# Patient Record
Sex: Female | Born: 1980 | Race: White | Hispanic: No | Marital: Single | Smoking: Never smoker
Health system: Southern US, Community
[De-identification: ages and names within clinical notes are randomized; demographics above are authoritative.]

## PROBLEM LIST (undated history)

## (undated) HISTORY — PX: TUBAL LIGATION: SHX77

---

## 2003-09-12 ENCOUNTER — Inpatient Hospital Stay (HOSPITAL_COMMUNITY): Admission: AD | Admit: 2003-09-12 | Discharge: 2003-09-18 | Payer: Self-pay | Admitting: Obstetrics and Gynecology

## 2003-09-15 ENCOUNTER — Encounter (INDEPENDENT_AMBULATORY_CARE_PROVIDER_SITE_OTHER): Payer: Self-pay | Admitting: Cardiology

## 2003-09-28 ENCOUNTER — Other Ambulatory Visit: Admission: RE | Admit: 2003-09-28 | Discharge: 2003-09-28 | Payer: Self-pay | Admitting: Obstetrics and Gynecology

## 2003-10-13 ENCOUNTER — Other Ambulatory Visit: Admission: RE | Admit: 2003-10-13 | Discharge: 2003-10-13 | Payer: Self-pay | Admitting: Obstetrics and Gynecology

## 2003-10-13 ENCOUNTER — Ambulatory Visit (HOSPITAL_COMMUNITY): Admission: RE | Admit: 2003-10-13 | Discharge: 2003-10-13 | Payer: Self-pay | Admitting: Obstetrics and Gynecology

## 2003-11-28 ENCOUNTER — Inpatient Hospital Stay (HOSPITAL_COMMUNITY): Admission: RE | Admit: 2003-11-28 | Discharge: 2003-11-30 | Payer: Self-pay | Admitting: Obstetrics and Gynecology

## 2003-11-29 ENCOUNTER — Ambulatory Visit: Payer: Self-pay | Admitting: Neonatology

## 2003-12-02 ENCOUNTER — Ambulatory Visit (HOSPITAL_COMMUNITY): Admission: RE | Admit: 2003-12-02 | Discharge: 2003-12-02 | Payer: Self-pay | Admitting: Obstetrics and Gynecology

## 2003-12-06 ENCOUNTER — Ambulatory Visit (HOSPITAL_COMMUNITY): Admission: RE | Admit: 2003-12-06 | Discharge: 2003-12-06 | Payer: Self-pay | Admitting: Obstetrics and Gynecology

## 2003-12-09 ENCOUNTER — Ambulatory Visit (HOSPITAL_COMMUNITY): Admission: RE | Admit: 2003-12-09 | Discharge: 2003-12-09 | Payer: Self-pay | Admitting: Obstetrics and Gynecology

## 2003-12-13 ENCOUNTER — Ambulatory Visit (HOSPITAL_COMMUNITY): Admission: RE | Admit: 2003-12-13 | Discharge: 2003-12-13 | Payer: Self-pay | Admitting: Obstetrics and Gynecology

## 2003-12-16 ENCOUNTER — Ambulatory Visit (HOSPITAL_COMMUNITY): Admission: RE | Admit: 2003-12-16 | Discharge: 2003-12-16 | Payer: Self-pay | Admitting: Obstetrics and Gynecology

## 2003-12-20 ENCOUNTER — Inpatient Hospital Stay (HOSPITAL_COMMUNITY): Admission: RE | Admit: 2003-12-20 | Discharge: 2003-12-21 | Payer: Self-pay | Admitting: Obstetrics and Gynecology

## 2003-12-26 ENCOUNTER — Ambulatory Visit (HOSPITAL_COMMUNITY): Admission: RE | Admit: 2003-12-26 | Discharge: 2003-12-26 | Payer: Self-pay | Admitting: Obstetrics and Gynecology

## 2003-12-30 ENCOUNTER — Ambulatory Visit (HOSPITAL_COMMUNITY): Admission: RE | Admit: 2003-12-30 | Discharge: 2003-12-30 | Payer: Self-pay | Admitting: Obstetrics and Gynecology

## 2004-01-02 ENCOUNTER — Ambulatory Visit (HOSPITAL_COMMUNITY): Admission: RE | Admit: 2004-01-02 | Discharge: 2004-01-02 | Payer: Self-pay | Admitting: Obstetrics and Gynecology

## 2004-01-05 ENCOUNTER — Ambulatory Visit (HOSPITAL_COMMUNITY): Admission: RE | Admit: 2004-01-05 | Discharge: 2004-01-05 | Payer: Self-pay | Admitting: Obstetrics and Gynecology

## 2004-01-12 ENCOUNTER — Ambulatory Visit (HOSPITAL_COMMUNITY): Admission: RE | Admit: 2004-01-12 | Discharge: 2004-01-12 | Payer: Self-pay | Admitting: Obstetrics and Gynecology

## 2004-01-17 ENCOUNTER — Ambulatory Visit (HOSPITAL_COMMUNITY): Admission: RE | Admit: 2004-01-17 | Discharge: 2004-01-17 | Payer: Self-pay | Admitting: Obstetrics and Gynecology

## 2004-01-19 ENCOUNTER — Ambulatory Visit (HOSPITAL_COMMUNITY): Admission: RE | Admit: 2004-01-19 | Discharge: 2004-01-19 | Payer: Self-pay | Admitting: Obstetrics and Gynecology

## 2004-01-23 ENCOUNTER — Ambulatory Visit: Payer: Self-pay | Admitting: Neonatology

## 2004-01-23 ENCOUNTER — Inpatient Hospital Stay (HOSPITAL_COMMUNITY): Admission: RE | Admit: 2004-01-23 | Discharge: 2004-01-27 | Payer: Self-pay | Admitting: Obstetrics and Gynecology

## 2004-01-24 ENCOUNTER — Encounter (INDEPENDENT_AMBULATORY_CARE_PROVIDER_SITE_OTHER): Payer: Self-pay | Admitting: *Deleted

## 2004-05-05 ENCOUNTER — Inpatient Hospital Stay (HOSPITAL_COMMUNITY): Admission: AD | Admit: 2004-05-05 | Discharge: 2004-05-05 | Payer: Self-pay | Admitting: Obstetrics & Gynecology

## 2004-10-12 ENCOUNTER — Other Ambulatory Visit: Admission: RE | Admit: 2004-10-12 | Discharge: 2004-10-12 | Payer: Self-pay | Admitting: Obstetrics and Gynecology

## 2005-02-06 ENCOUNTER — Inpatient Hospital Stay (HOSPITAL_COMMUNITY): Admission: AD | Admit: 2005-02-06 | Discharge: 2005-02-06 | Payer: Self-pay | Admitting: Obstetrics and Gynecology

## 2005-02-19 ENCOUNTER — Emergency Department (HOSPITAL_COMMUNITY): Admission: EM | Admit: 2005-02-19 | Discharge: 2005-02-20 | Payer: Self-pay | Admitting: Emergency Medicine

## 2005-12-13 ENCOUNTER — Ambulatory Visit: Payer: Self-pay | Admitting: Family Medicine

## 2006-02-12 ENCOUNTER — Ambulatory Visit: Payer: Self-pay | Admitting: Family Medicine

## 2006-02-12 ENCOUNTER — Other Ambulatory Visit: Admission: RE | Admit: 2006-02-12 | Discharge: 2006-02-12 | Payer: Self-pay | Admitting: Family Medicine

## 2006-03-29 IMAGING — US US OB FOLLOW-UP
1 series · 12 of 28 positions shown · non-contrast
Comparison: none

CLINICAL DATA: Growth and Dopplers.

[Series 1: us ob follow-up · 0.35mm/px · 12 of 89 slices shown]
[im 4/89]
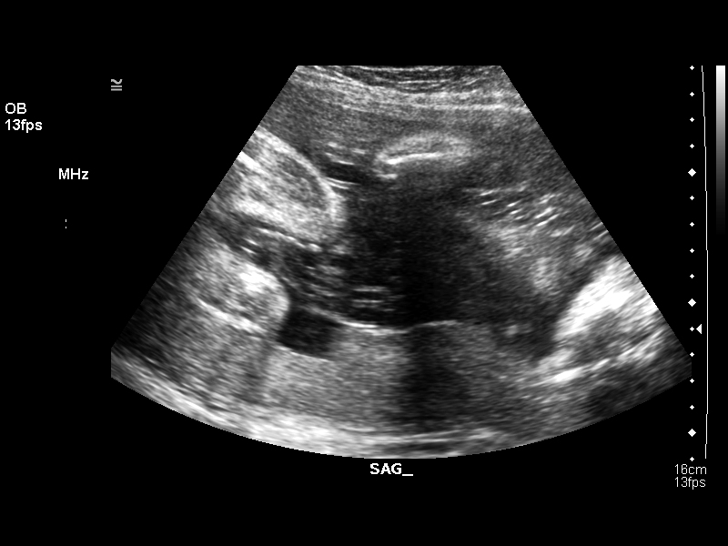
[im 10/89]
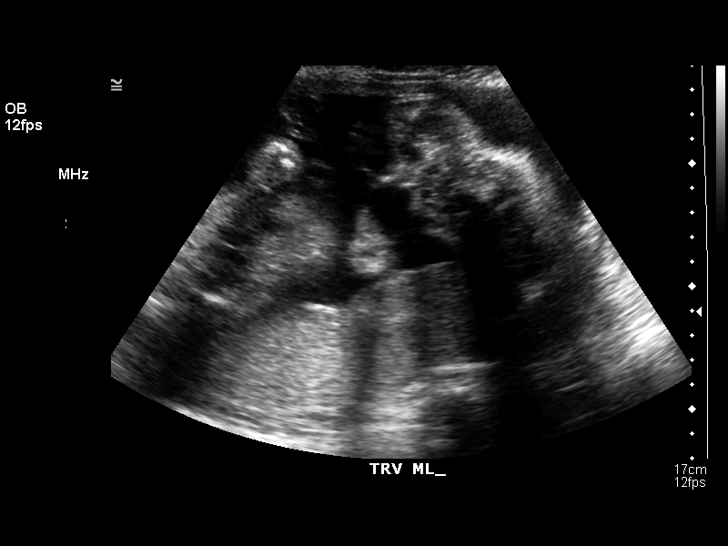
[im 17/89]
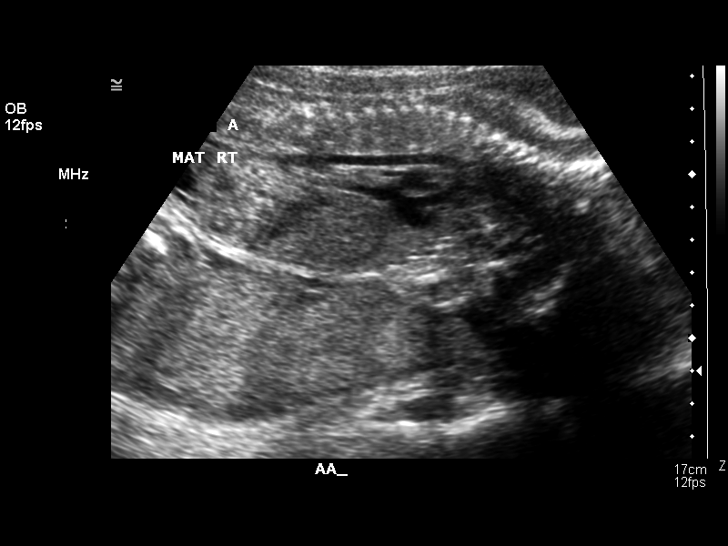
[im 27/89]
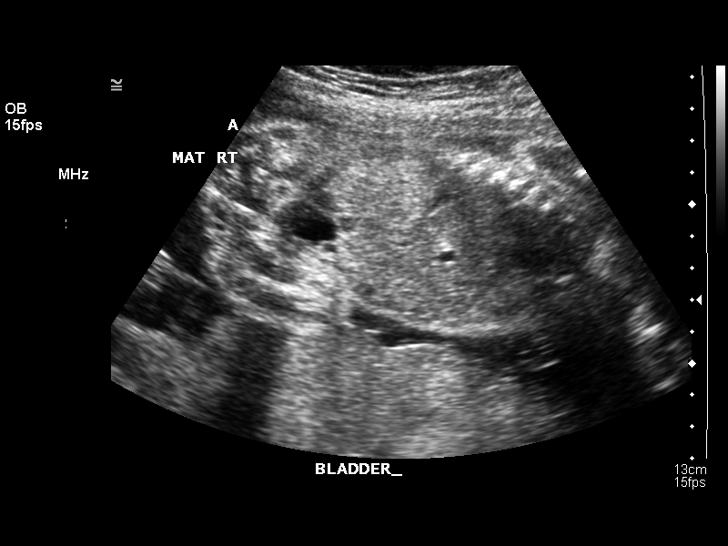
[im 33/89]
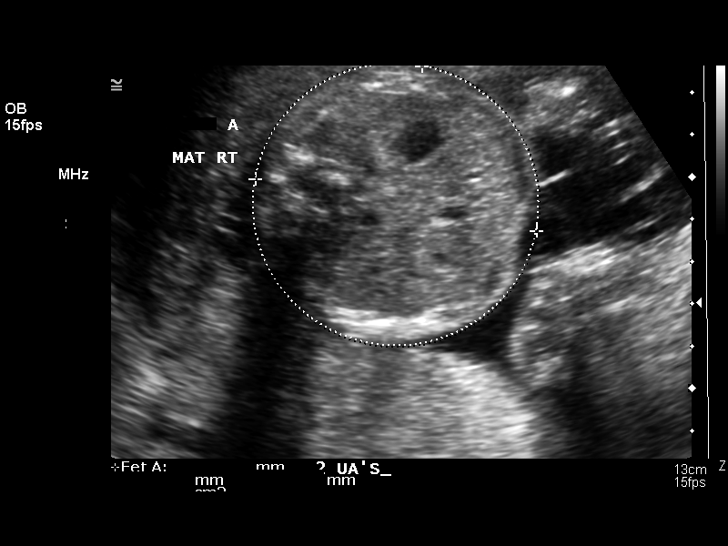
[im 40/89]
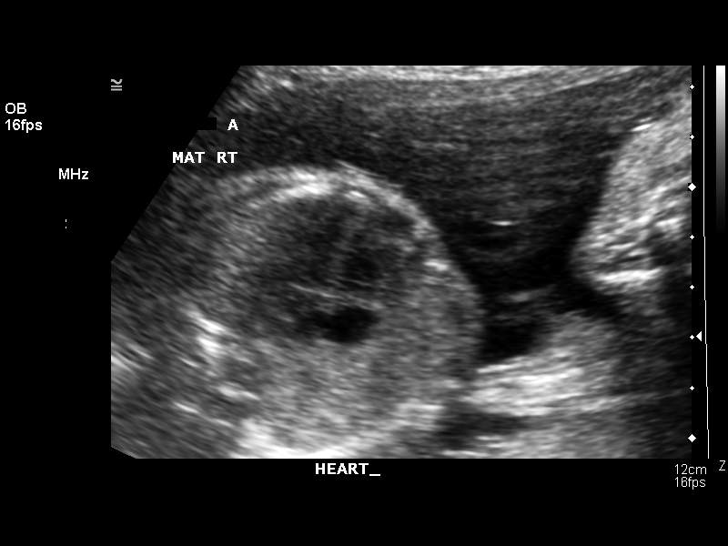
[im 49/89]
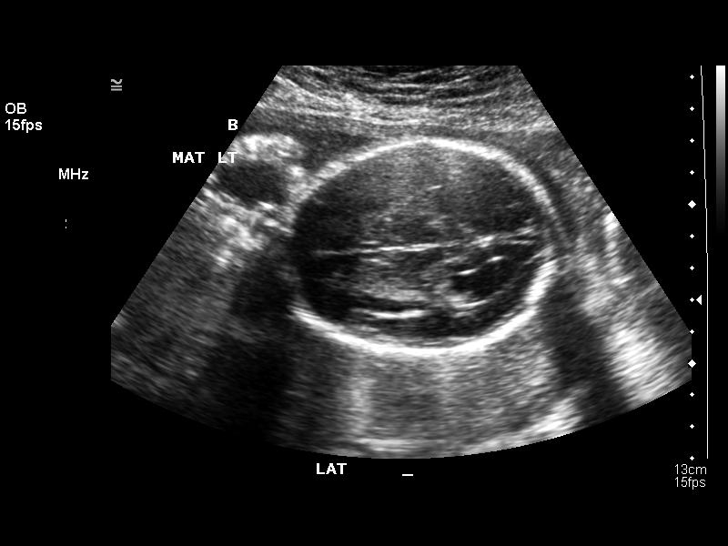
[im 56/89]
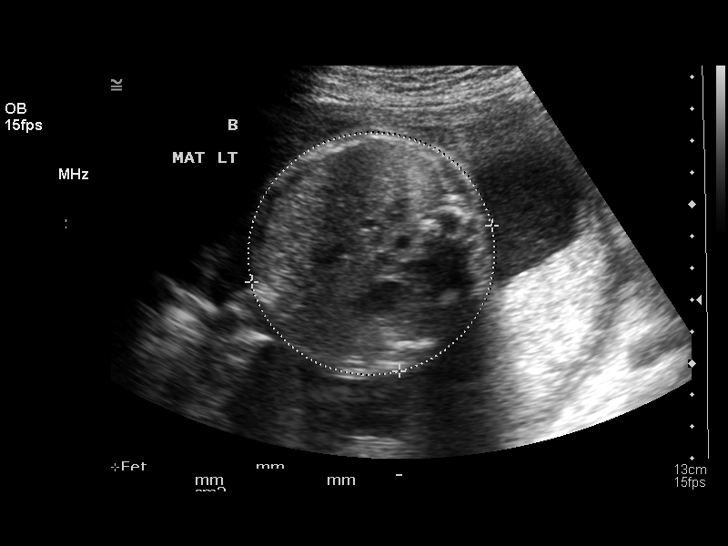
[im 62/89]
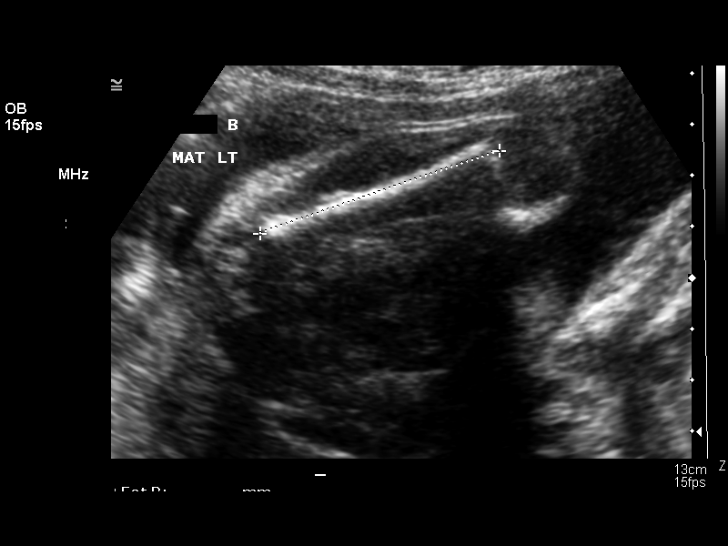
[im 72/89]
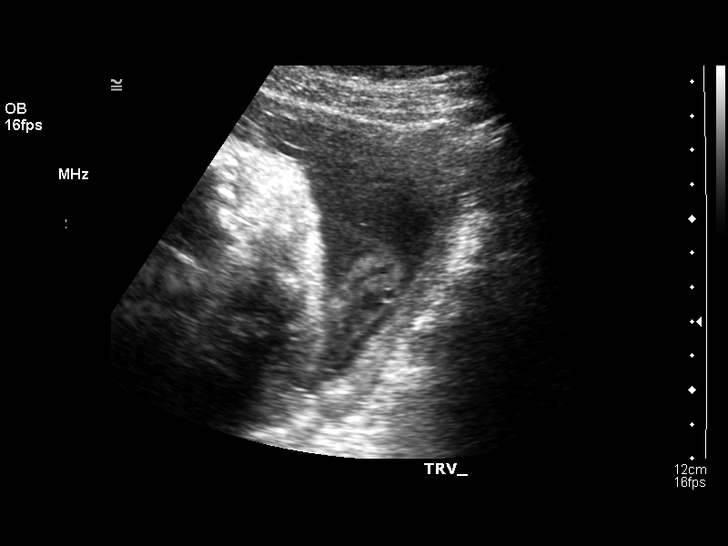
[im 79/89]
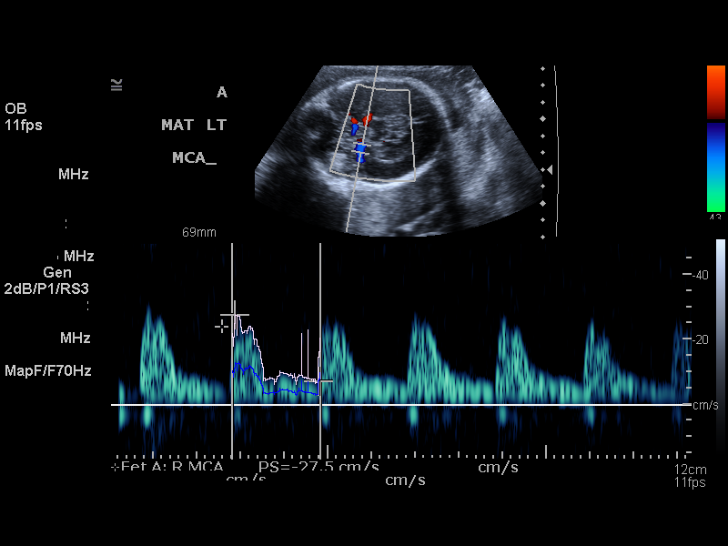
[im 85/89]
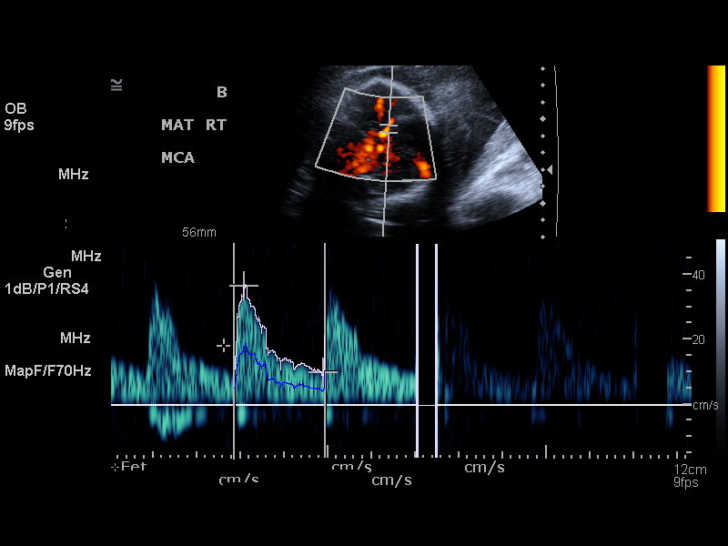

[12 of 28 positions shown; findings below may reference images not displayed]

TWIN OBSTETRICAL ULTRASOUND REEVALUATION:
A living twin gestation is present.  A separating membrane is seen.

TWIN A:
Heart Rate:  163
Movement:  Yes
Breathing:  Yes
Presentation:   Cephalic on the maternal left 
Placental Location:  Posterior
Grade:  I
Previa:  No
Amniotic Fluid (Subjective):    Normal
Amniotic Fluid (Objective):   3.1 cm Vertical pocket 

FETAL BIOMETRY (TWIN A)
BPD:  6.6 cm   26 w 5 d
HC:  24.8 cm   27 w 0 d
AC:  23.6 cm   27 w 6 d
FL:  5.0 cm  26 w 6 d

Mean GA:  27 w 1 d
Fetal indices are within normal limits
EFW:  9967 g (H) 75th ? 90th%ile (1914 ? 4466 g) for 27 wks

FETAL ANATOMY (TWIN A)
Lateral Ventricles:  Visualized 
Thalami/CSP:  Visualized 
Posterior Fossa:  Previously seen 
Nuchal Region:  Previously seen 
Spine:  Previously seen 
4 Chamber Heart on L:  Previously seen 
Stomach on Left:  Visualized 
3 Vessel Cord:  Previously seen 
Cord Insertion Site:  Previously seen 
Kidneys:  Visualized 
Bladder:  Visualized 
Extremities:  Previously seen 
Comment:  The lateral ventricular measurement for twin A today is 9.7 mm.  This is just below the upper limits of normal and continued close follow-up for ventricular size is recommended to exclude developing early ventricular dilatation.
TWIN B:
Heart Rate:  160
Movement:  Yes
Breathing:  Yes
Presentation:  Cephalic on the maternal right TWIN B IS PRESENTING TODAY
Placental Location:  Posterior
Grade:  I
Previa:  No
Amniotic Fluid (Subjective):  Normal
Amniotic Fluid (Objective):   4.6 cm Vertical pocket 

FETAL BIOMETRY (TWIN B)
BPD:  6.7 cm   27 w 0 d
HC:  24.3 cm   26 w 3 d
AC:  21.0 cm   25 w 4 d
FL:  4.7 cm   25 w 4 d

Mean GA:  26 w 1 d
Fetal indices are within normal limits
EFW:  839 g (H) 25th ? 50th%ile (754 ? 875 g) For 27 wks

FETAL ANATOMY (TWIN B)
Lateral Ventricles:  Visualized 
Thalami/CSP:  Visualized 
Posterior Fossa:  Previously seen 
Nuchal Region:  Previously seen 
Spine:  Previously seen 
4 Chamber Heart on L:  Visualized 
Stomach on Left:  Visualized 
3 Vessel Cord:  Visualized 
Cord Insertion Site:  Visualized 
Kidneys:  Visualized 
Bladder:  Visualized 
Extremities:  Previously seen 

MATERNAL FINDINGS
Cervix:  3.5 cm Transabdominally

DOPPLER ULTRASOUND OF TWIN GESTATION:

DOPPLER MEASUREMENTS (TWIN A)
Umbilical Artery S/D Ratio: 272   (NL< 4.76)

Middle Cerebral Artery PI: 1.80   (NL> 1.54)

DOPPLER MEASUREMENTS (TWIN B)
Umbilical Artery S/D Ratio: 3.52   (NL< 4.76)

Middle Cerebral Artery PI:  1.42    (NL> 1.54)
IMPRESSION: Monochorionic, diamniotic twin gestation with twin A demonstrating an estimated gestational age by ultrasound of 27 weeks and 1 day and twin B of 26 weeks and 1 day.  Currently the estimated fetal weight for twin A is just above the 75th percentile and the estimated fetal weight for twin B is just below the 50th percentile.  Today?s findings suggest discordance in growth and for this reason fetal Doppler exam was performed.  Continued close follow-up for growth is recommended.
Subjectively and quantitatively normal amniotic fluid volume is seen for both twins.  
Normal umbilical artery and middle cerebral artery Doppler assessment for twin A.  
Normal umbilical artery and low middle cerebral artery Doppler value for twin B.  No absence or reversal of end diastolic flow was seen on any of interrogated strips.  
Normal cervical length.
The lateral ventricular measurement on twin A is at the upper limits of normal at 9.7 mm and continued close follow-up is recommended to exclude developing early ventriculomegaly.
Twin B is currently presenting.  
Today?s scan findings were called to Dr. Gurajena?[REDACTED] and the patient was sent to [facility] for follow-up immediately after this exam.

## 2006-04-06 IMAGING — US US UA ADDL GEST RE-EVAL
1 series · 13 of 16 positions shown · non-contrast
Comparison: none

CLINICAL DATA: Monochorionic, diamniotic twin gestation with assigned gestational age of 28 weeks 0 days.  Decreased amniotic fluid volume for twin B.  Follow-up amniotic fluid volume, biophysical profile, and umbilical artery Dopplers.

[Series 1: us ua addl gest re-eval · 0.29mm/px · 13 of 57 slices shown]
[im 1/57]
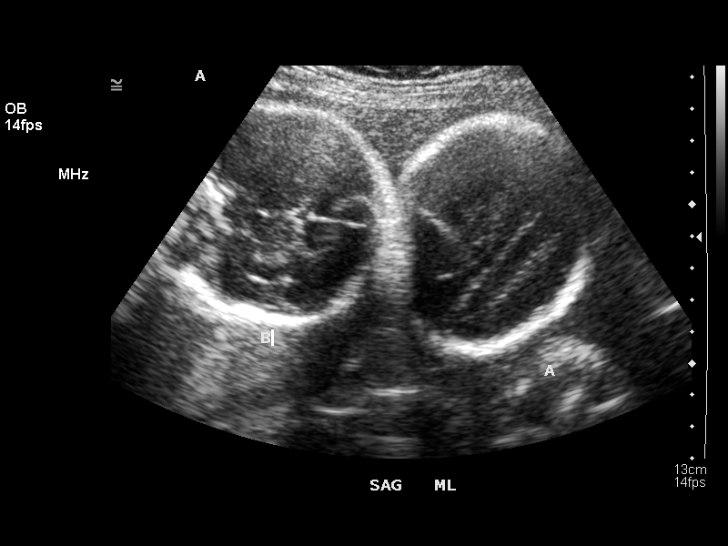
[im 4/57]
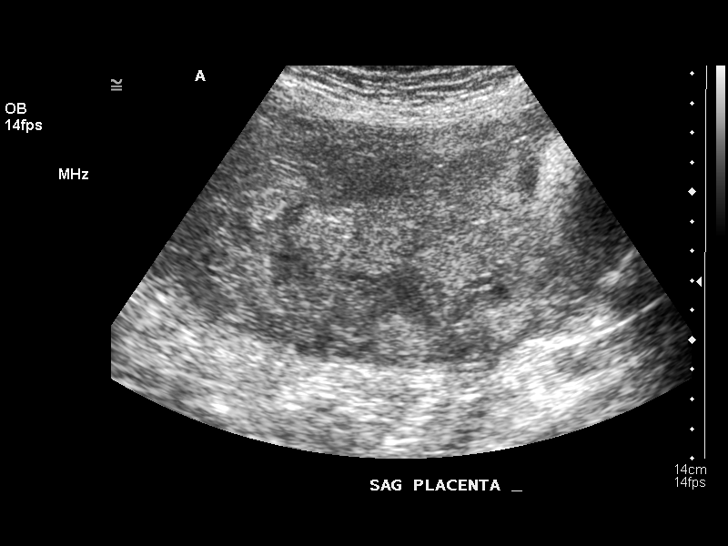
[im 12/57]
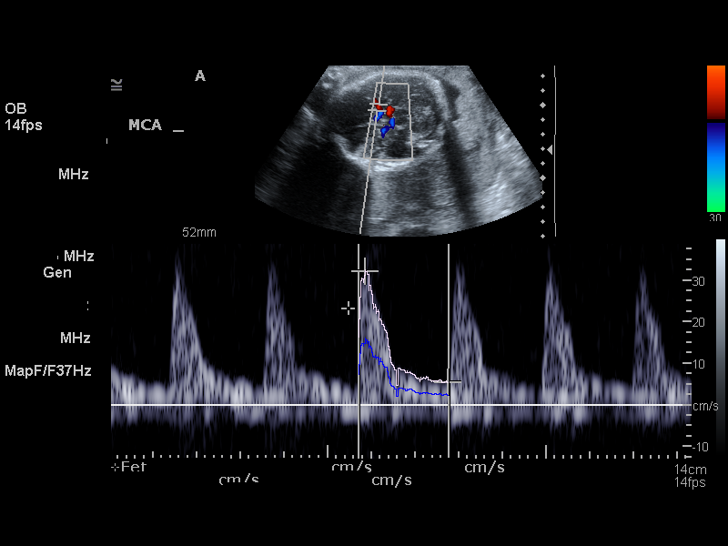
[im 15/57]
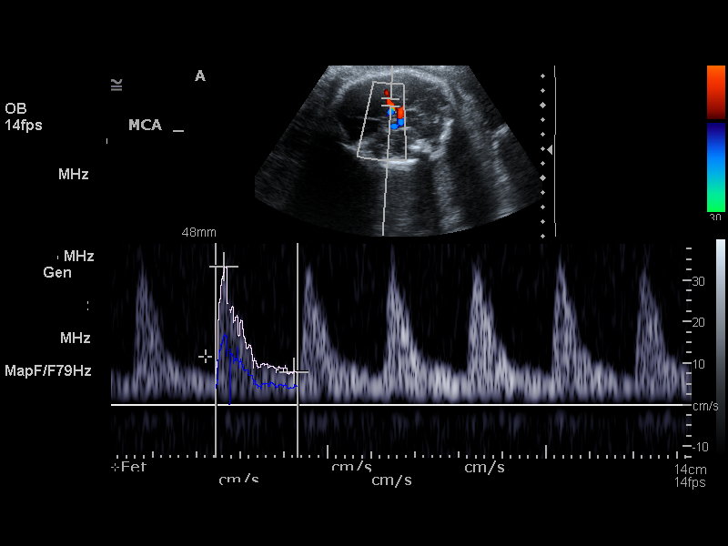
[im 19/57]
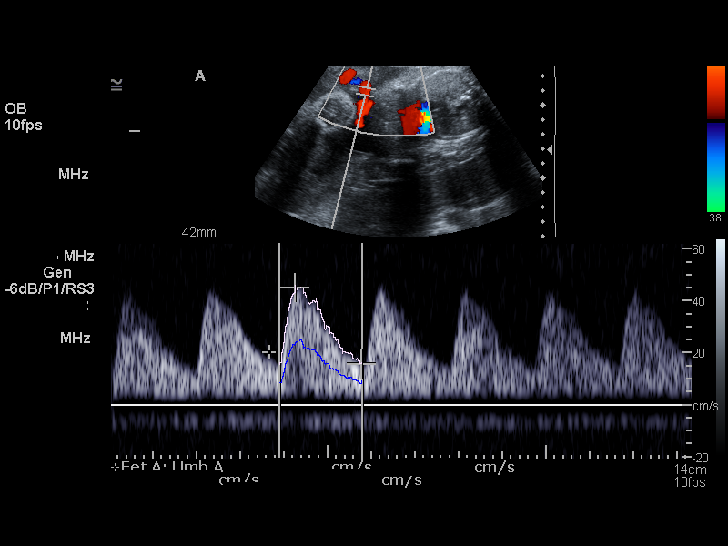
[im 23/57]
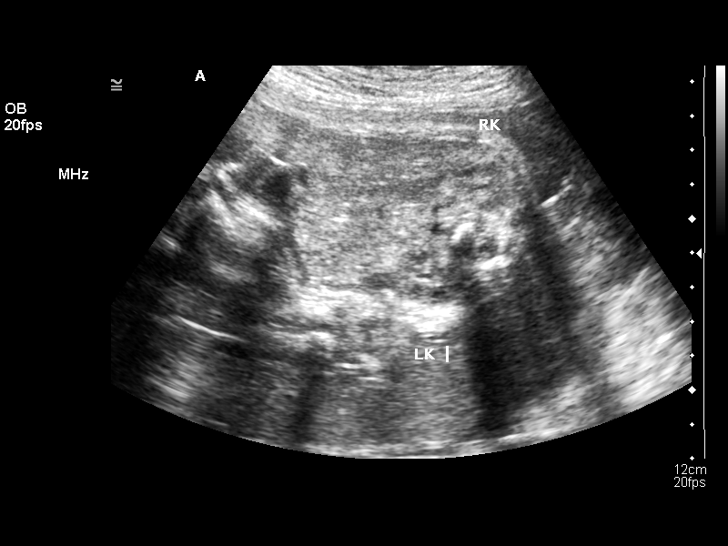
[im 30/57]
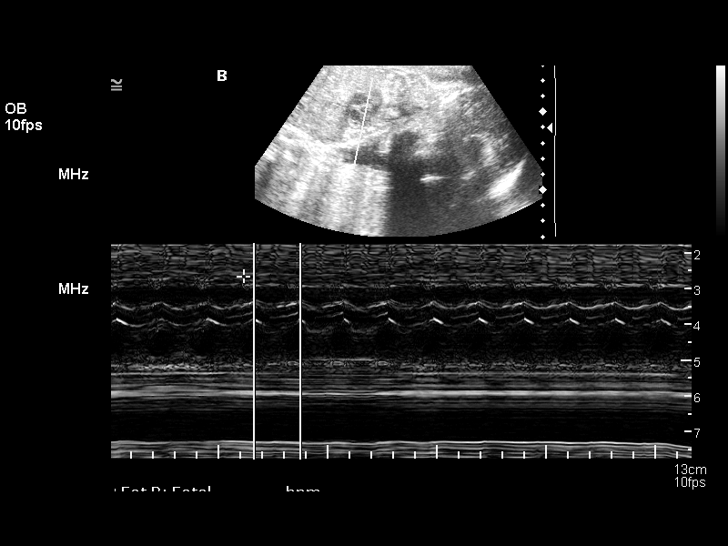
[im 34/57]
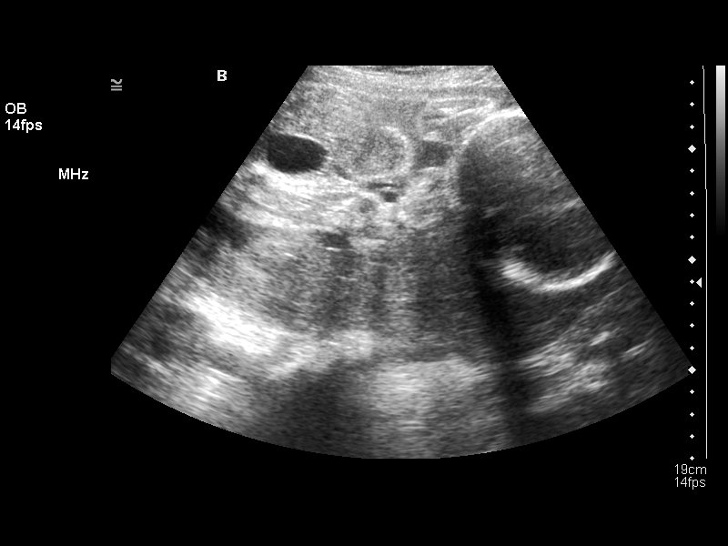
[im 38/57]
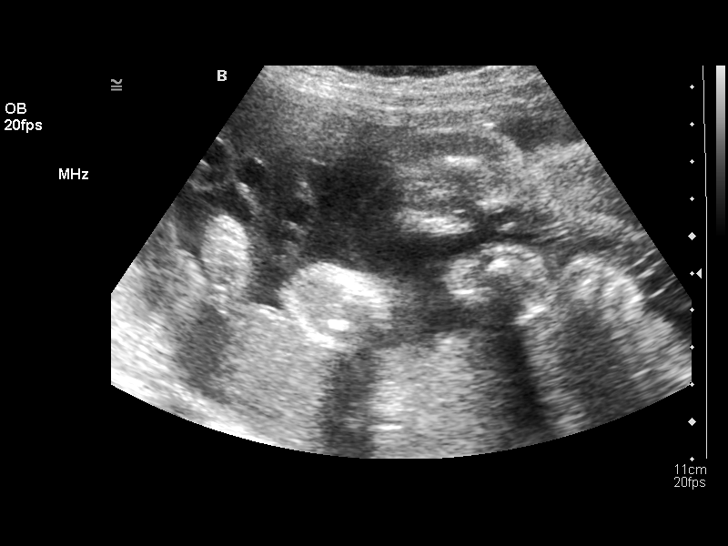
[im 42/57]
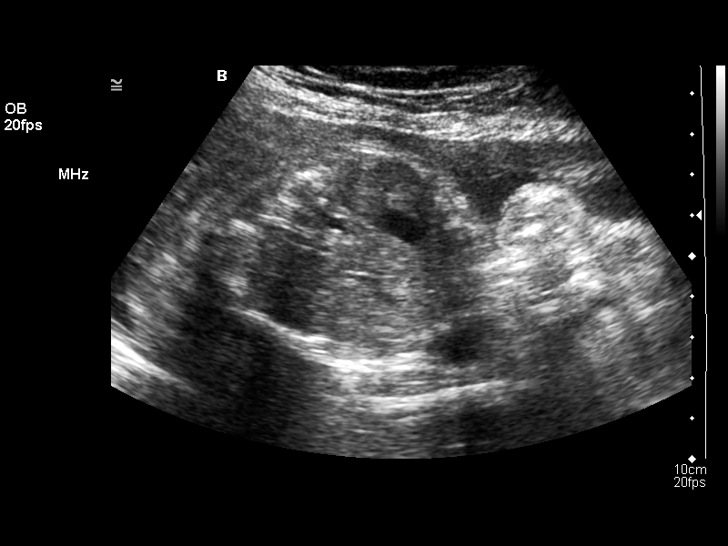
[im 45/57]
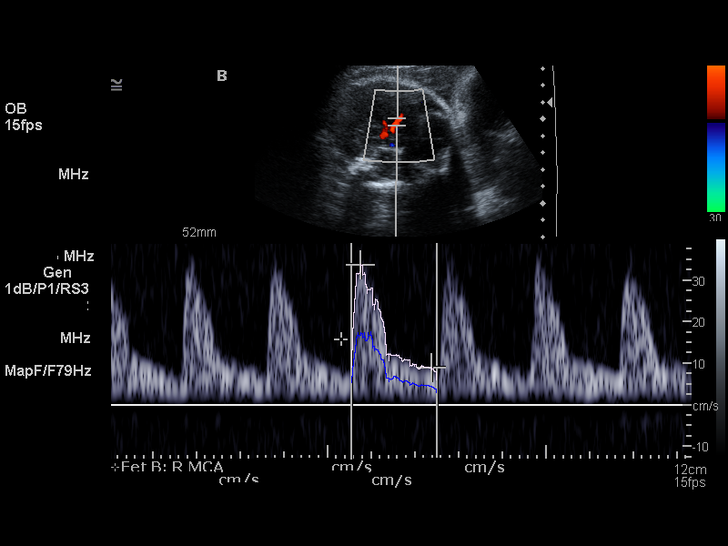
[im 53/57]
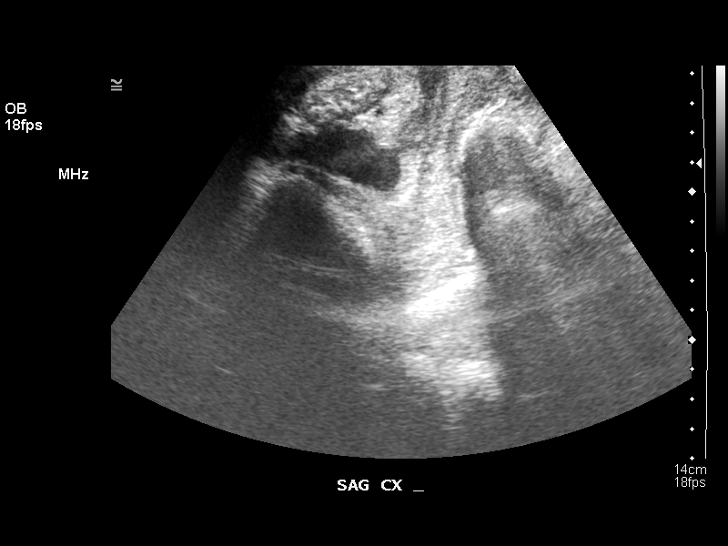
[im 57/57]
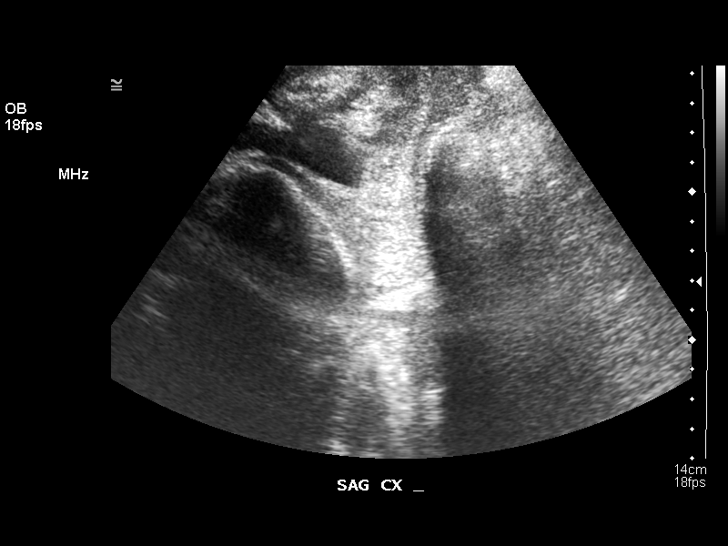

[13 of 16 positions shown; findings below may reference images not displayed]

TWIN GESTATION LIMITED OBSTETRICAL ULTRASOUND:

A living twin gestation is present.  A separating membrane is seen.

TWIN A (NON-PRESENTING):
Heart Rate:  147
Movement:  Yes
Breathing:  Yes
Presentation:  Cephalic on the maternal left side
Placental Location:  Posterior
Grade:  I
Previa:  No
Amniotic Fluid (Subjective):  Normal
Amniotic Fluid (Objective):  3.0 cm Vertical pocket 

Fetal measurements and complete anatomic evaluation were not requested.  The following fetal anatomy for Twin A was visualized during this exam:  Lateral ventricles, stomach, kidneys, and bladder.  

TWIN B (PRESENTING TWIN):
Heart Rate:  141
Movement:  Yes
Breathing:  Yes
Presentation:  Cephalic on the maternal right
Placental Location:  Posterior
Grade: I 
Previa:    No
Amniotic Fluid (Subjective):  Normal
Amniotic Fluid (Objective):  3.8 cm Vertical pocket 

Fetal measurements and complete anatomic evaluation were not requested.  The following fetal anatomy for Twin B was visualized during this exam:  Lateral ventricles, posterior fossa, four chamber heart, kidneys, bladder.

MATERNAL FINDINGS
Cervix:  3.0 cm translabially
IMPRESSION: Living monochorionic, diamniotic twin gestation.  Normal amniotic fluid volume for both twins.  
BIOPHYSICAL PROFILE OF TWIN GESTATION:

BPP SCORING (TWIN A)
Movement:  2    Time:  30 minutes
Breathing:  2
Tone:  2
Amniotic Fluid:  2

Total Score:  8

BPP SCORING (TWIN B)
Movements:  2    Time:  30 minutes
Breathing:  2
Tone:  2
Amniotic Fluid:  2

Total Score:  8
IMPRESSION: Biophysical profile score [DATE] for both fetuses.
UA DOPPLER ULTRASOUND OF TWIN GESTATION:

TWIN A
Umbilical Artery S/D Ratio:  3.12   (NL< 4.55)

TWIN B
Umbilical Artery S/D Ratio:  2.56    (NL< 4.55)
IMPRESSION: Umbilical artery S/D ratios are within normal limits for both fetuses.

## 2006-04-15 ENCOUNTER — Ambulatory Visit: Payer: Self-pay | Admitting: Family Medicine

## 2006-04-21 IMAGING — US US UA DOPPLER RE-EVAL
1 series · 13 of 28 positions shown · non-contrast
Comparison: none

CLINICAL DATA: Twin gestation with growth discrepancy.  Twin B oligohydramnios.

[Series 1: us ua doppler re-eval · 0.20mm/px · 13 of 57 slices shown]
[im 3/57]
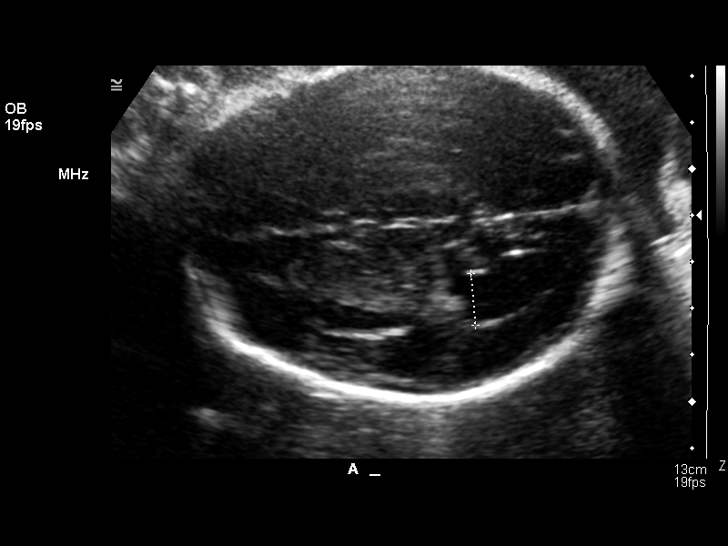
[im 7/57]
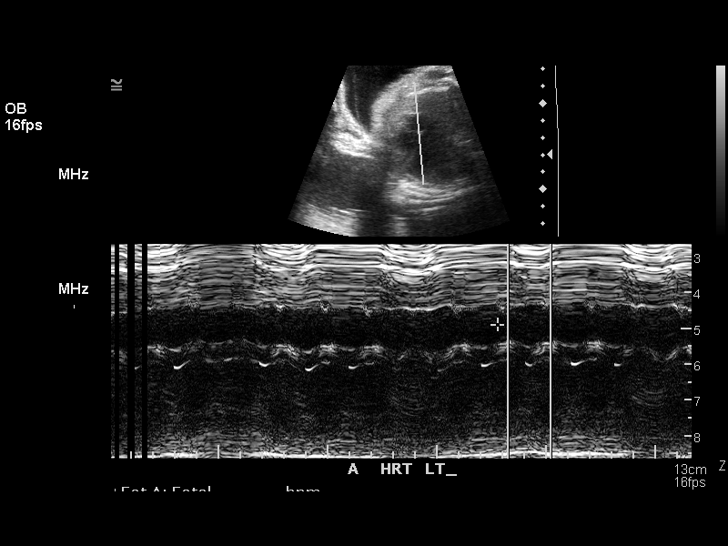
[im 11/57]
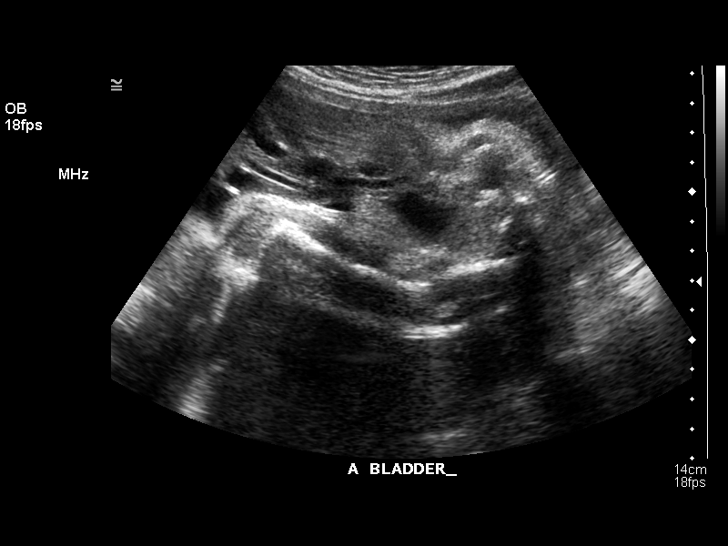
[im 15/57]
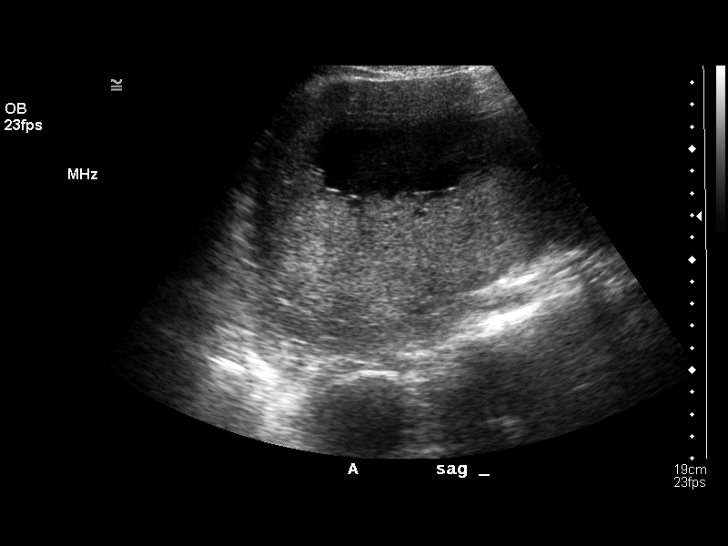
[im 19/57]
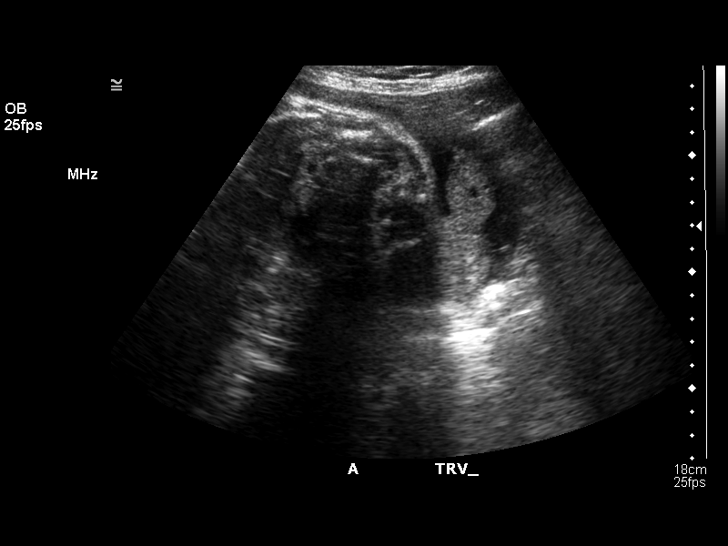
[im 23/57]
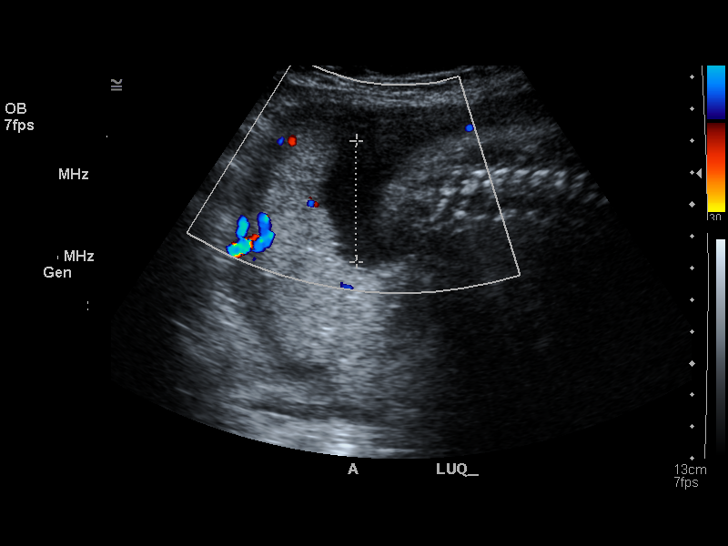
[im 30/57]
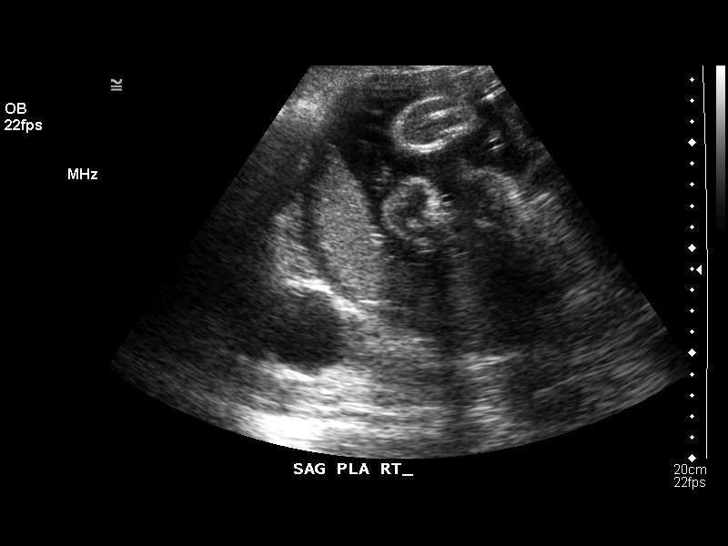
[im 34/57]
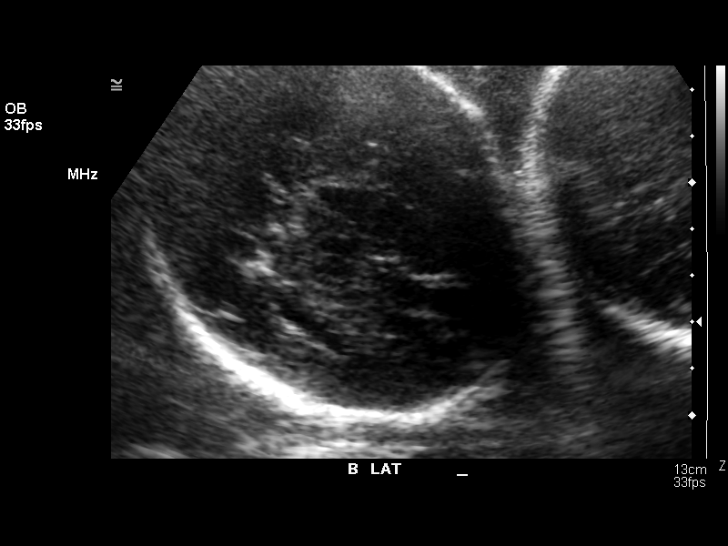
[im 38/57]
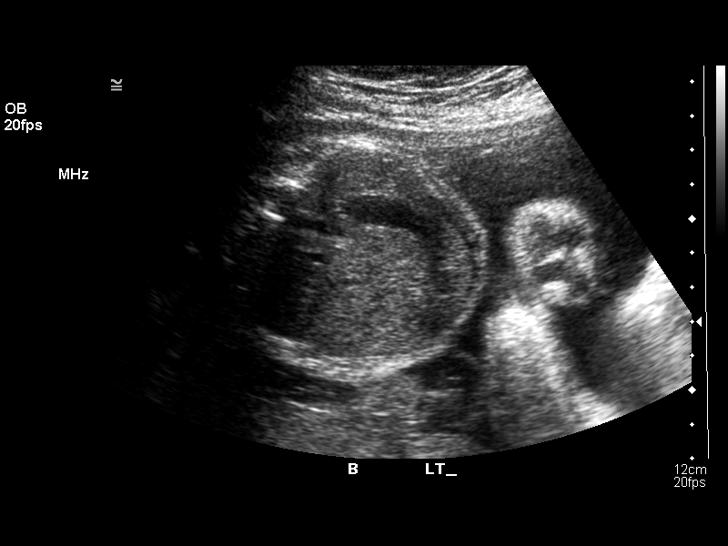
[im 42/57]
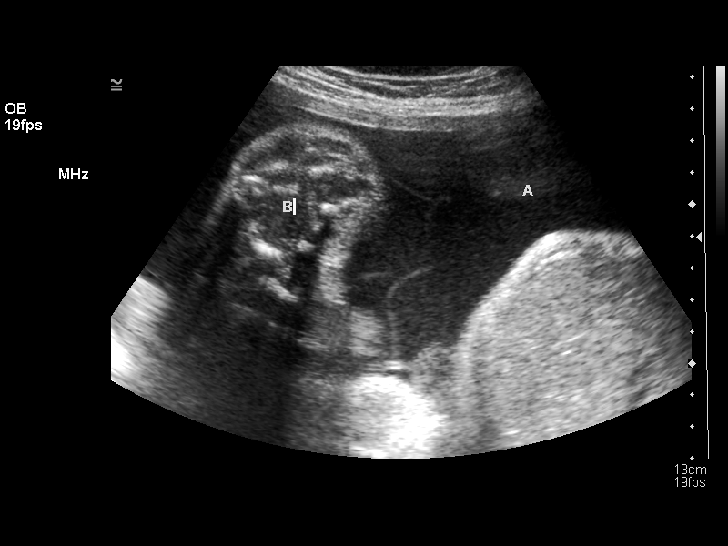
[im 46/57]
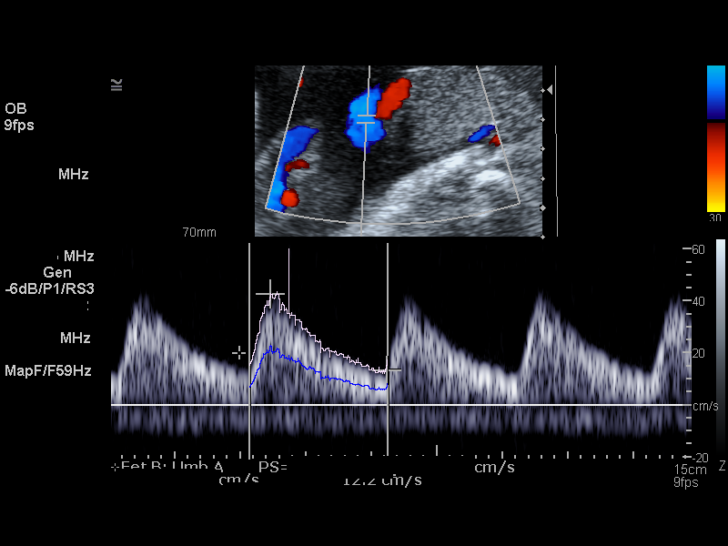
[im 50/57]
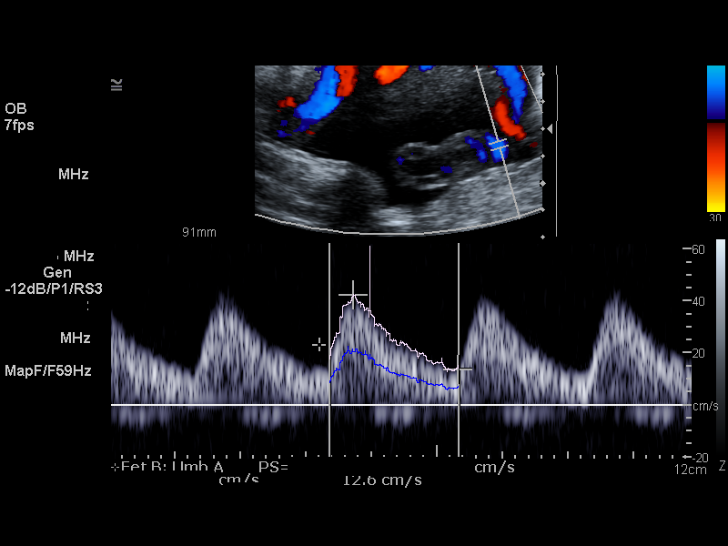
[im 54/57]
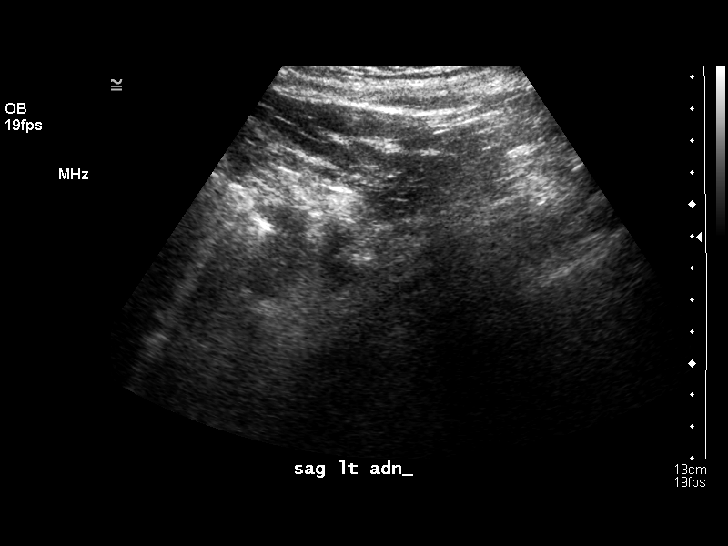

[13 of 28 positions shown; findings below may reference images not displayed]

TWIN GESTATION LIMITED OBSTETRICAL ULTRASOUND:

 A living twin gestation is present.  A separating membrane is seen.

 TWIN A (NONPRESENTING):  
 Heart Rate:  153
 Movement:  Yes
 Breathing:  Yes
 Presentation:  Cephalic on the maternal left
 Placental Location:  Posterior
 Grade:  I
 Previa:  No
 Amniotic Fluid (Subjective):  Normal
 Amniotic Fluid (Objective):  3.8 cm Vertical pocket 

 Fetal measurements and complete anatomic evaluation were not requested.  The following fetal anatomy for Twin A was visualized during this exam:  Lateral ventricles, four chamber heart, stomach, kidneys, profile, diaphragm, and female genitalia.  

 TWIN B (PRESENTING):
 Heart Rate:  157
 Movement:  Yes
 Breathing:  Yes
 Presentation:  Cephalic on the maternal right (Presenting)
 Placental Location:  Posterior
 Grade:  I
 Previa:  No
 Amniotic Fluid (Subjective):  Normal
 Amniotic Fluid (Objective):  4.4 cm Vertical pocket 

 Fetal measurements and complete anatomic evaluation were not requested.  The following fetal anatomy for Twin B was visualized during this exam:   Lateral ventricles stomach, kidneys and bladder.  

 MATERNAL UTERINE AND ADNEXAL FINDINGS
 Cervix:  3.6 cm Transabdominally

 DOPPLER ULTRASOUND OF TWIN B:  

 Umbilical Artery S/D Ratio:   3.13   (NL< 4.17)
IMPRESSION: 1.  Twin living intrauterine gestation described previously as monochorionic, diamniotic.  Assigned gestational age for the pregnancy is 30 weeks 1 day.  
 2.  Lateral ventricle for the A twin is measured at 11 mm.  
 3.  Umbilical artery Doppler evaluation for twin B is within normal limits.

## 2006-05-03 IMAGING — US US FETAL BPP W/O NONSTRESS
1 series · 13 of 26 positions shown · non-contrast
Comparison: none

CLINICAL DATA: Twins.  Assess fetal well-being.

[Series 1: us fetal bpp w/o nonstress · 13 of 26 slices shown]
[im 2/26]
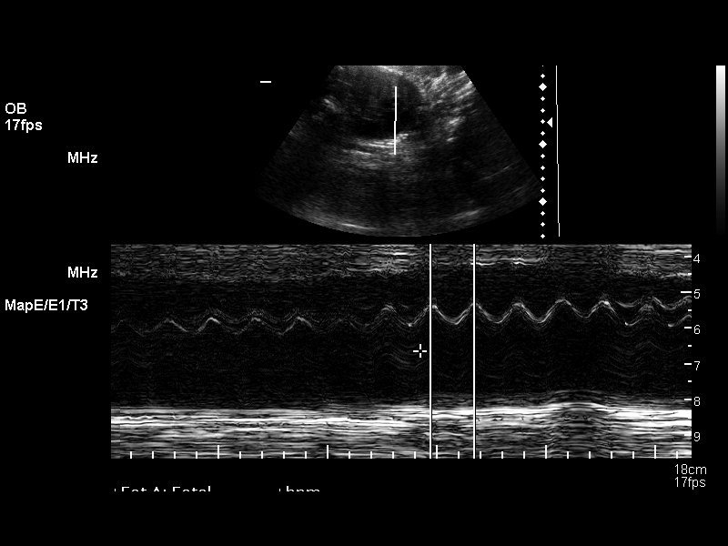
[im 4/26]
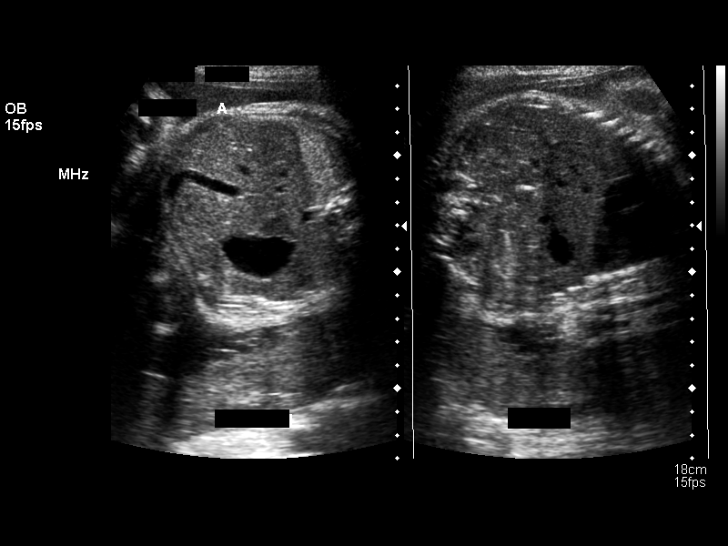
[im 6/26]
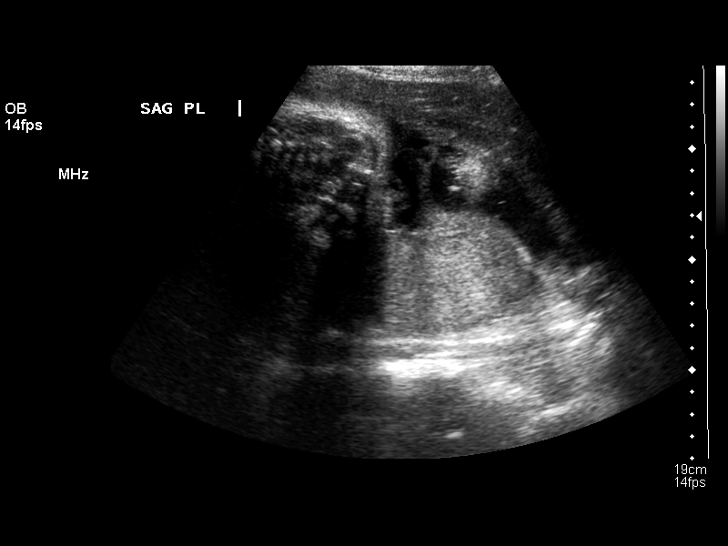
[im 8/26]
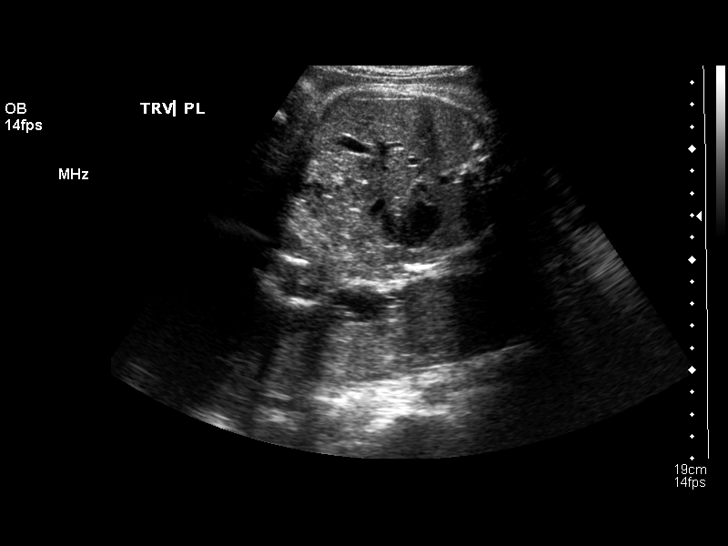
[im 10/26]
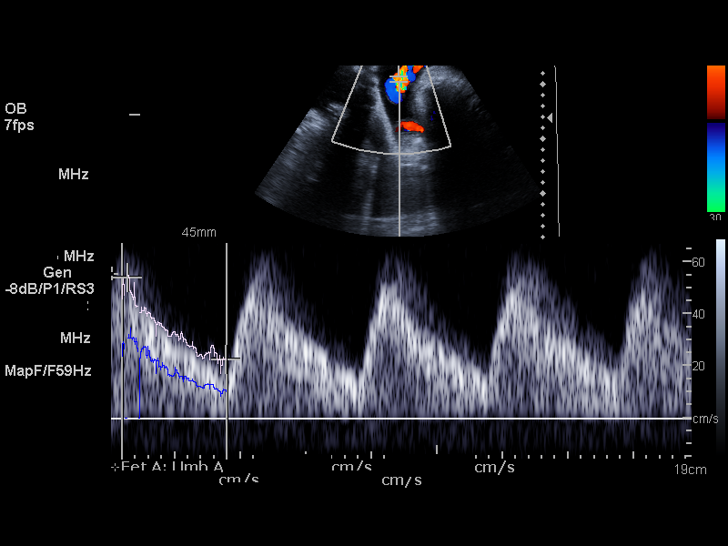
[im 12/26]
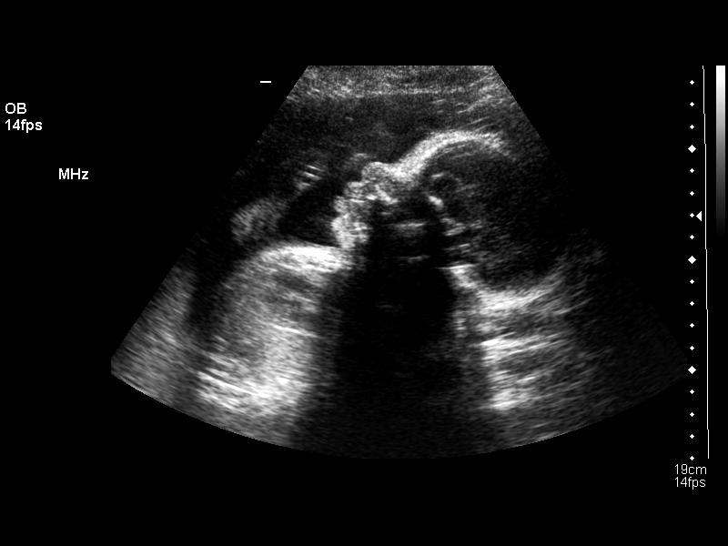
[im 14/26]
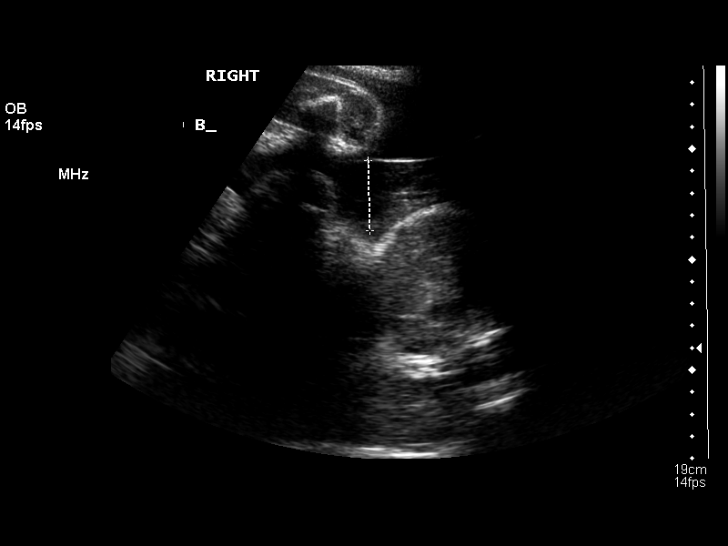
[im 16/26]
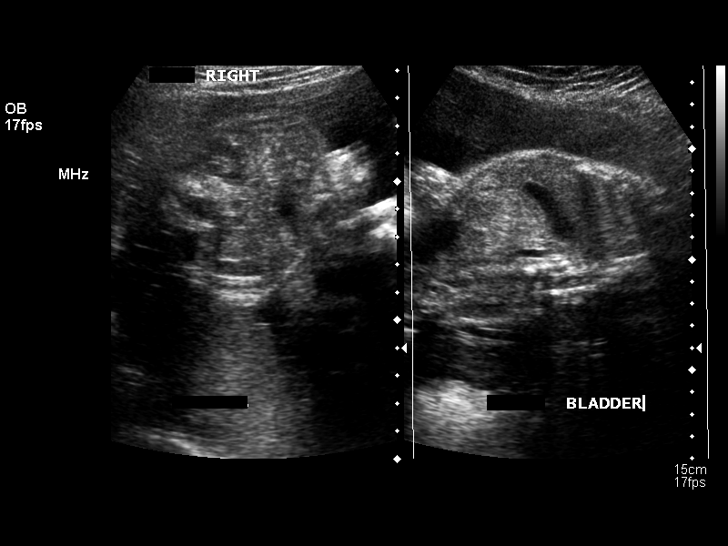
[im 18/26]
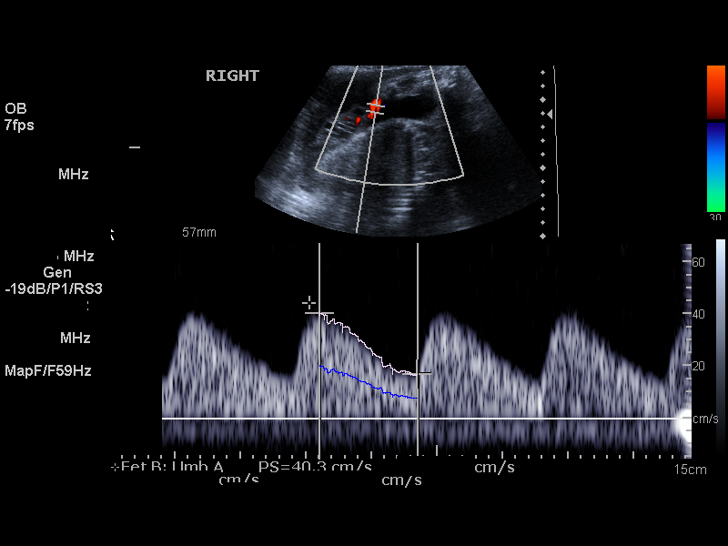
[im 20/26]
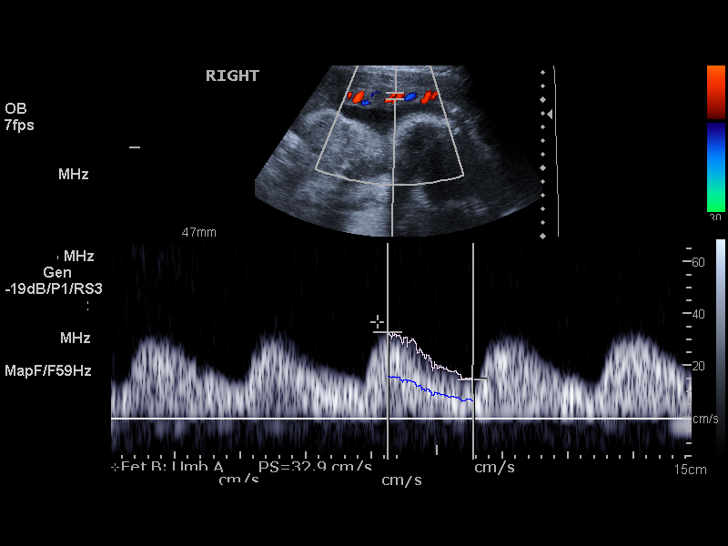
[im 22/26]
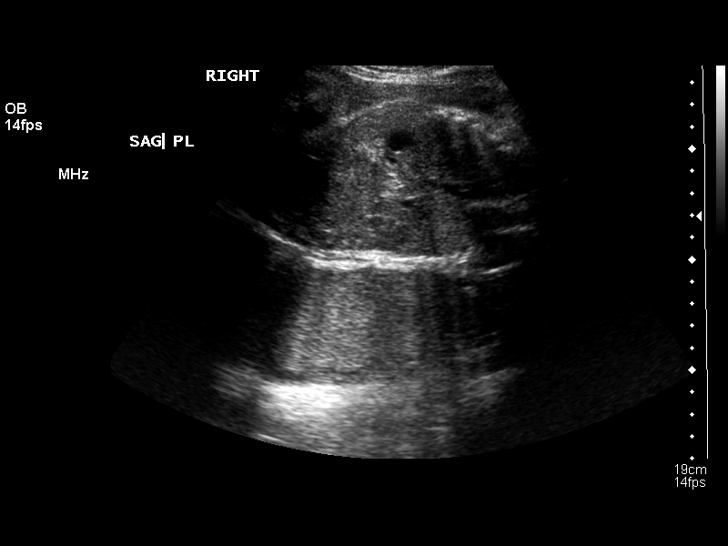
[im 24/26]
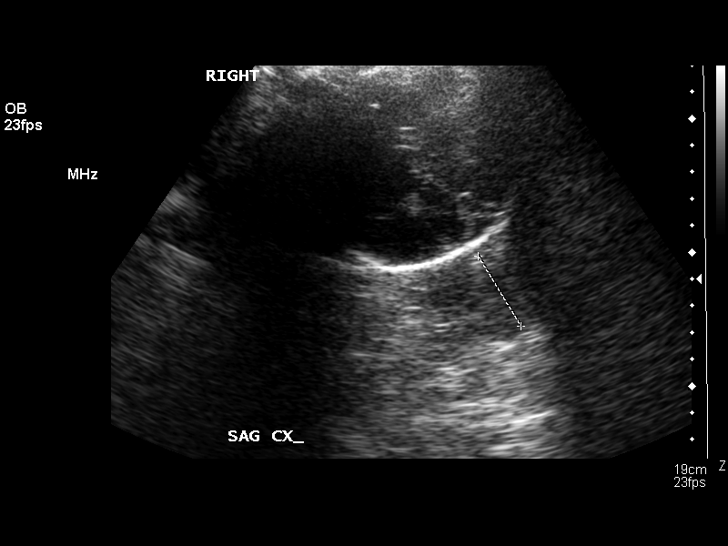
[im 26/26]
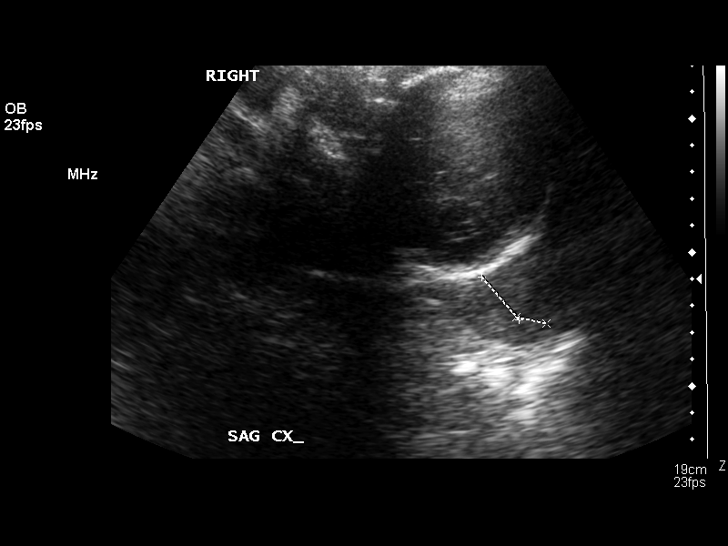

[13 of 26 positions shown; findings below may reference images not displayed]

TWIN GESTATION LIMITED OBSTETRICAL ULTRASOUND:

A living twin gestation is present.  A separating membrane is seen.

TWIN A:
Heart Rate:  150
Movement:  Yes
Breathing:  Yes
Presentation:  Cephalic on the maternal left
Placental Location:  Posterior
Grade:  I
Previa:  No
Amniotic Fluid (Subjective):  Normal
Amniotic Fluid (Objective):  3.4 cm Vertical pocket 

Fetal measurements and complete anatomic evaluation were not requested.  The following fetal anatomy for Twin A was visualized during this exam:  Stomach, kidneys and bladder. 

TWIN B:
Heart Rate:  153
Movement:  Yes
Breathing:  Yes
Presentation:  Cephalic on maternal right
Placental Location:  Posterior
Grade:  I
Previa:  No
Amniotic Fluid (Subjective):  Normal
Amniotic Fluid (Objective):  3.4 cm Vertical pocket 

Fetal measurements and complete anatomic evaluation were not requested.  The following fetal anatomy for Twin B was visualized during this exam:  Stomach, kidneys and bladder.  

MATERNAL UTERINE AND ADNEXAL FINDINGS
Cervix:  3.2 cm Transabdominally

BIOPHYSICAL PROFILE OF TWIN GESTATION:

BPP SCORING (TWIN A)
Movement:  2    Time:  15 Minutes
Breathing:  2
Tone:  2
Amniotic Fluid:  2

Total Score:  8

BPP SCORING (TWIN B)
Movements: 2    Time:  15 minutes
Breathing:  2
Tone:  2
Amniotic Fluid:  2

Total Score:  8

DOPPLER ULTRASOUND OF TWIN GESTATION:

DOPPLER MEASUREMENTS (TWIN A)
Umbilical Artery S/D Ratio:  2.37   (NL< 3.85)

DOPPLER MEASUREMENTS (TWIN B)
Umbilical Artery S/D Ratio:  2.45    (NL< 3.85)
IMPRESSION: 1.  Subjectively and quantitatively normal amniotic fluid volume on both twins.
2.  Biophysical profile scores [DATE] for both twins.
3.  Normal umbilical artery Doppler exam for both twins.
4.  Normal cervical length.
5.  No late developing fetal anatomic abnormalities are identified associated with the stomach, kidneys or bladder.  The lateral ventricles and four chamber heart view were not visualized on either twin due to positioning.

## 2006-05-21 ENCOUNTER — Ambulatory Visit: Payer: Self-pay | Admitting: Family Medicine

## 2006-05-24 ENCOUNTER — Emergency Department (HOSPITAL_COMMUNITY): Admission: EM | Admit: 2006-05-24 | Discharge: 2006-05-24 | Payer: Self-pay | Admitting: Emergency Medicine

## 2006-06-09 ENCOUNTER — Emergency Department (HOSPITAL_COMMUNITY): Admission: EM | Admit: 2006-06-09 | Discharge: 2006-06-09 | Payer: Self-pay | Admitting: Emergency Medicine

## 2006-07-14 ENCOUNTER — Emergency Department (HOSPITAL_COMMUNITY): Admission: EM | Admit: 2006-07-14 | Discharge: 2006-07-14 | Payer: Self-pay | Admitting: Emergency Medicine

## 2007-09-27 ENCOUNTER — Emergency Department (HOSPITAL_COMMUNITY): Admission: EM | Admit: 2007-09-27 | Discharge: 2007-09-27 | Payer: Self-pay | Admitting: Emergency Medicine

## 2007-12-14 ENCOUNTER — Emergency Department (HOSPITAL_COMMUNITY): Admission: EM | Admit: 2007-12-14 | Discharge: 2007-12-14 | Payer: Self-pay | Admitting: Emergency Medicine

## 2008-03-21 ENCOUNTER — Other Ambulatory Visit: Admission: RE | Admit: 2008-03-21 | Discharge: 2008-03-21 | Payer: Self-pay | Admitting: Obstetrics & Gynecology

## 2008-08-30 ENCOUNTER — Emergency Department (HOSPITAL_COMMUNITY): Admission: EM | Admit: 2008-08-30 | Discharge: 2008-08-30 | Payer: Self-pay | Admitting: Emergency Medicine

## 2009-04-03 ENCOUNTER — Other Ambulatory Visit: Admission: RE | Admit: 2009-04-03 | Discharge: 2009-04-03 | Payer: Self-pay | Admitting: Obstetrics & Gynecology

## 2009-06-30 ENCOUNTER — Ambulatory Visit: Payer: Self-pay | Admitting: Family Medicine

## 2009-06-30 ENCOUNTER — Inpatient Hospital Stay (HOSPITAL_COMMUNITY): Admission: AD | Admit: 2009-06-30 | Discharge: 2009-07-03 | Payer: Self-pay | Admitting: Obstetrics & Gynecology

## 2009-07-01 ENCOUNTER — Encounter: Payer: Self-pay | Admitting: Obstetrics & Gynecology

## 2010-02-03 ENCOUNTER — Encounter: Payer: Self-pay | Admitting: Obstetrics and Gynecology

## 2010-02-04 ENCOUNTER — Encounter: Payer: Self-pay | Admitting: Obstetrics and Gynecology

## 2010-04-01 LAB — CBC
HCT: 25.3 % — ABNORMAL LOW (ref 36.0–46.0)
HCT: 34.2 % — ABNORMAL LOW (ref 36.0–46.0)
Hemoglobin: 11.7 g/dL — ABNORMAL LOW (ref 12.0–15.0)
Hemoglobin: 8.8 g/dL — ABNORMAL LOW (ref 12.0–15.0)
MCHC: 34.2 g/dL (ref 30.0–36.0)
MCV: 88.7 fL (ref 78.0–100.0)
RBC: 2.82 MIL/uL — ABNORMAL LOW (ref 3.87–5.11)
WBC: 17.4 10*3/uL — ABNORMAL HIGH (ref 4.0–10.5)
WBC: 18.9 10*3/uL — ABNORMAL HIGH (ref 4.0–10.5)

## 2010-06-01 NOTE — Op Note (Signed)
[name], [name]:  [id number]   MEDICAL RECORD NO.:  [id number]          PATIENT TYPE:  INP   LOCATION:  9199                          FACILITY:  [facility]   PHYSICIAN:  Janine Limbo, M.D.DATE OF BIRTH:  [birth year]   DATE OF PROCEDURE:  01/24/2004  DATE OF DISCHARGE:                                 OPERATIVE REPORT   PREOPERATIVE DIAGNOSIS:  1.  [redacted] weeks gestation.  2.  Twins.  3.  Intrauterine growth retardation for the presenting twin.  4.  Nonreassuring fetal heart rate tracing.   POSTOPERATIVE DIAGNOSIS:  1.  [redacted] weeks gestation.  2.  Twins.  3.  Intrauterine growth retardation for the presenting twin.  4.  Nonreassuring fetal heart rate tracing.   PROCEDURE:  STAT low transverse cesarean section.   SURGEON:  Janine Limbo, M.D.   FIRST ASSISTANT:  Concha Pyo. Duplantis, C.N.M.   ANESTHESIA:  General .   DISPOSITION:  Megan Anthony is a 30 year old, gravida 2, para 0-0-1-0, who was  admitted at [redacted] weeks gestation with intrauterine growth retardation for the  presenting twin.  Induction of labor was recommended and begun.  The patient  contracted overnight and began contracting again on January 24, 2004, with  the assistance of Pitocin.  She initially did well with her labor, but then  began having recurrent deep variable decelerations that did not respond to  positioning, oxygen, fluids, and cessation of Pitocin augmentation.  We  discussed the risks associated with cesarean section.  The specific risks  were reviewed which included, but are not limited to, anesthetic  complications, bleeding, infections, and possible damage to the surrounding  organs.  I recommended that we proceed immediately to cesarean delivery and  the patient agreed.   FINDINGS:  The first infant delivered was a 3 pound 9 ounce female infant  Megan protection practitioner).  The Apgars were 9 at 1 minute and 9 at 5 minutes.  The cord  blood pH was 7.18.  The second infant delivered was  a 5 pound 2 ounce female  infant Marcelino Duster) and the Apgars for that baby were 8 at 1 minute and 9 at 5  minutes.  The uterus, fallopian tubes, and the ovaries were normal for the  gravid state.   PROCEDURE:  The patient was taken to the operating room in an emergent  fashion.  The patient's abdomen, perineum, and outer vagina were prepped  with multiple layers of Betadine.  A Foley catheter was placed in the  bladder.  The patient was sterilely draped.  General anesthesia was then  induced.  A low transverse incision was made in the abdomen and carried  sharply through the subcutaneous tissue, the fascia, and the anterior  peritoneum.  An incision was made in the lower uterine segment and extended  transversely.  The fetal head was delivered without difficulty.  A shoulder  cord was present.  The cord was clamped and cut and the infant was handed to  the awaiting pediatric team.  Routine cord blood studies were obtained.  The  second infant was delivered from a  cephalic presentation.  The cord was  clamped and cut and the infant was handed to the awaiting pediatric team.  Again, cord blood studies were obtained.  The placenta was then removed.  The placenta was sent to pathology.  The  uterine cavity was cleaned of  amniotic fluid, clotted blood, and membranes.  The uterine incision was  closed using a running locking suture of 2-0 Vicryl followed by an  imbricating suture of 2-0 Vicryl.  Figure-of-eight sutures of 2-0 Vicryl  were used for hemostasis.  Hemostasis was noted to be adequate.  The pelvis  was vigorously irrigated.  The anterior peritoneum and the abdominal  musculature were reapproximated in the midline.  The fascia and the  subcutaneous tissue were irrigated.  The fascia was closed using a running  suture of 0 Vicryl followed by three interrupted sutures of 0 Vicryl.  The  subcutaneous layer was closed using 0 Vicryl.  The skin was reapproximated  using a subcuticular  suture of 4-0 Vicryl.  Sponge, needle, and instrument  counts were correct.  Estimated blood loss for the procedure was 800 mL.  The patient tolerated the procedure well.  The patient was taken to the  recovery room after the general anesthetic was reversed.  She was in stable  condition.  Both infants were taken to the full term nursery in stable  condition.     Arth   AVS/MEDQ  D:  01/24/2004  T:  01/24/2004  Job:  [id number]

## 2010-06-01 NOTE — H&P (Signed)
Megan Anthony, Megan Anthony               ACCOUNT NO.:  [id number]   MEDICAL RECORD NO.:  [id number]          PATIENT TYPE:  INP   LOCATION:  9157                          FACILITY:  [facility]   PHYSICIAN:  Vicki L. Latham, C.N.M.DATE OF BIRTH:  11/29/1980   DATE OF ADMISSION:  12/20/2003  DATE OF DISCHARGE:                                HISTORY & PHYSICAL   HISTORY OF PRESENT ILLNESS:  Megan Anthony is a 30 year old, gravida 2, para 0-0-  1-0, at 45 and 6/7ths weeks with twins, who is admitted from ultrasound  status post an ultrasound showing abnormal Dopplers of the uterine artery of  twin B.  Discordant growth was also noted ___________ with twin A in the  75th to 90th percentile, twin B in the 25th to 50th percentile.  The patient  had a previous admission November 28, 2003 to November 30, 2003 for  discordant growth and abnormal MCA Doppler of twin B.  This Doppler issue  resolved, fluid always remained normal, and the patient was discharged home.  The patient is therefore admitted today for observation and repeat Dopplers  tomorrow.  The pregnancy has been remarkable for (1) twin gestation, (2)  growth discrepancy with twin B, approximately 400 gm smaller than twin A,  (3) history of pyelonephritis, (4) abnormal uterine artery Doppler, twin B.   PRENATAL LABORATORIES:  Blood type is O positive.  Rh antibody negative.  VDRL nonreactive.  Rubella titer positive.  Hepatitis B surface antigen  negative.  Hemoglobin upon entering the practice was 11.3.  RPR was  nonreactive.   PREVIOUS OBSTETRICAL HISTORY:  She had one elective AB in 2003.  Questions  LMP for this pregnancy.   GYNECOLOGICAL HISTORY:  She was previously on Yasmin and [facility]  Lo.   MEDICAL HISTORY:  She reports the usual childhood illnesses.  She was  hospitalized in August with pyelonephritis and developed pulmonary edema  during that hospitalization.   ALLERGIES:  No known medication allergies.   FAMILY HISTORY:   Maternal grandmother and paternal grandmother had adult  onset diabetes.  Paternal aunt had kidney cancer.  Maternal grandmother had  early breast cancer.   GENETIC HISTORY:  Unremarkable.   SOCIAL HISTORY:  The patient is single.  The father of the babies name is  Barbara Cower.  He is generally involved.  The patient is currently unemployed, but  has a high school education.  She is Caucasian in ethnicity.  She denies any  alcohol, drug, or tobacco use during this pregnancy.   PHYSICAL EXAMINATION:  VITAL SIGNS:  Stable.  The patient is afebrile.  HEENT:  Within normal limits.  LUNGS:  Breath sounds are bilateral.  HEART:  Regular rate and rhythm without murmur.  BREASTS:  Soft and nontender.  ABDOMEN:  Fundal height is approximately 33-34 cm.  Fetuses are  vertex/vertex on ultrasound today.  Fetal heart rate is reactive x2 with  occasional mild uterine contractions.  Ultrasound today shows twin A vertex  on the maternal left.  Fluid is normal.  Posterior placenta is noted.  Estimated fetal weight is 1650 gm (at  the 75th to 90th percentile).  Umbilical artery Dopplers are normal.  BPP was 8/8.  Twin B was in the  vertex presentation on the maternal right.  Fluid was normal.  Estimated  fetal weight was 1247 gm at the 25th to 50% percentile.  Uterine artery  Dopplers were abnormal.  MCA was within normal limits.  BPP was 8/8.  PELVIC:  Cervix was 3.3 cm, long, and closed.  EXTREMITIES:  Deep tendon reflexes were 2+ without clonus.  There is a trace  edema noted.   LABORATORY DATA:  UA was negative in the office today.   IMPRESSION:  1.  Twin intrauterine pregnancy.  2.  Discordant growth.  3.  Abnormal uterine artery Doppler of twin B.   PLAN:  1.  Admit to antenatal unit for consult with Dr. Su Hilt as attending      physician.  2.  Continuous electronic fetal monitoring.  3.  Plan is made to repeat the uterine artery Doppler on twin B in the      morning with reflex MCA Doppler if  UA Doppler is abnormal      VLL/MEDQ  D:  12/20/2003  T:  12/20/2003  Job:  [id number]

## 2010-06-01 NOTE — H&P (Signed)
Megan Anthony, Megan Anthony               ACCOUNT NO.:  [id number]   MEDICAL RECORD NO.:  [id number]          PATIENT TYPE:  INP   LOCATION:  9156                          FACILITY:  [facility]   PHYSICIAN:  Hal Morales, M.D.DATE OF BIRTH:  June 21, 1980   DATE OF ADMISSION:  11/28/2003  DATE OF DISCHARGE:                                HISTORY & PHYSICAL   Megan Anthony is a 30 year old single white female, gravida 2, para 0-0-1-0, at  65 weeks with twins, who is admitted for observation secondary to noting  discordant growth of her twins on ultrasound today.  Twin A was noted to be  75th in 90th percentile and twin B 25th to 50th percentile with an abnormal  MCA with an abnormal MCAPI on twin B.  Upon admission, the patient also  reported having an admission since Friday that would come down while she was  taking Tylenol but resurged any time the Tylenol wore off.  She denies any  nausea, vomiting, headaches, or back pain.  She denies any dysuria or  cramping.  She denies any vaginal bleeding or leaking.  She also denies  cough or flu symptoms.  She does report having a single episode of diarrhea  on Thursday, November 10.  Her pregnancy has been followed at [facility] by the MD service and has been complicated by (1) Twin  gestation.  (2) History of pyelonephritis with this pregnancy, treated as an  inpatient at [facility] on August 29 through September 18, 2003.  She  also developed pulmonary edema following her initial antibiotic therapy.   OBSTETRIC AND GYNECOLOGIC HISTORY:  She is a gravida 2, para 0-0-1-0.  She  had one elective AB in 2003.  Other gyn history is essentially  noncontributory.  She has an unknown LMP for this pregnancy.  Her EDC is  established by ultrasound of February 28, 2004, on a 16 week ultrasound.   GENERAL MEDICAL HISTORY:  She has no known drug allergies.  She reports  having had the usual childhood diseases.  She has a history of  pyelonephritis  with this pregnancy and the only hospitalization was for that  pyelonephritis that she had earlier this pregnancy.   FAMILY HISTORY:  Significant for maternal grandmother and paternal  grandmother with adult onset diabetes and a paternal aunt with kidney cancer  and maternal grandmother with early breast cancer.   GENETIC HISTORY:  Negative.   SOCIAL HISTORY:  She is single.  The father of the baby is Megan Anthony.  He is  generally supportive.  She is currently unemployed but is high school  educated.  She denies any religious affiliation that will affect her care.   PRENATAL LABORATORY DATA:  Her blood type is O positive.  Her antibody  screen is negative.  Syphilis is nonreactive.  Rubella is positive.  Hepatitis B surface antigen is negative.  Hemoglobin is 11.3, hematocrit  35.1, and her clean-catch urine today shows turbid in color, small amount of  bilirubin, moderate amount of blood, 100 mg/dl of protein, positive  nitrites, large leukocytes, and many bacteria.  PHYSICAL EXAMINATION:  VITAL SIGNS:  Her temperature is 103.7.  Her blood  pressure is 117/67.  Her pulse is 126, and respirations are 20.  Her fetal  heart rate on A is 140s-150s, on B is 160s-170s.  HEART:  Regular rhythm and rate and tachycardic.  LUNGS:  Clear.  She has negative CVA tenderness.  BREASTS:  Soft and nontender.  ABDOMEN:  Gravid with uterine contractions noted to be every 1.5-4 minutes.  Her ultrasound today shows baby A at 75th to 90th percentile with estimated  fetal weight of 1068 g, measuring 27 weeks and a day.  Placenta is  posterior.  Fluid is 3.1 cm pocket.  Her UA ratio is 2.72, and MCAPI is 1.8.  Her cervical length is 3.5 cm.  On twin B, she measures 25th to 50th  percentile.  Estimated fetal weight of 839 g, measuring 26-1/7 weeks.  Placenta course posterior.  Fluid 4.6 pocket.  Uterine artery SD ratio is  3.52 and MCAPI is 1.42 with a normal being greater than 1.54.   ASSESSMENT:   Intrauterine pregnancy at 27 weeks with twin gestation with  discordant growth recently diagnosed and probably pyelonephritis.   PLAN:  Admit for continuous electronic fetal monitoring, IV antibiotics, IV  fluids, fetal fibronectin, 1 hour Glucola, and then betamethasone for fetal  lung maturity and preparation.     Shel   SJD/MEDQ  D:  11/28/2003  T:  11/28/2003  Job:  [id number]

## 2010-06-01 NOTE — H&P (Signed)
NAMEJERIANNE, [name]:  [id number]   MEDICAL RECORD NO.:  [id number]                   PATIENT TYPE:  INP   LOCATION:  9311                                 FACILITY:  [facility]   PHYSICIAN:  Megan Anthony, M.D.            DATE OF BIRTH:  1980/10/10   DATE OF ADMISSION:  09/12/2003  DATE OF DISCHARGE:                                HISTORY & PHYSICAL   HISTORY OF PRESENT ILLNESS:  Megan Anthony is a 30 year old gravida 1, para 0 at  approximately 36 weeks' gestation, who presented to the office today for her  first visit with a complaint of fever and low back pain.  She had a positive  UPT at [facility] approximately August 29, 2003.  She  began having back pain and fever, and frequency and urgency on Saturday.  She reports that her back pain is now much improved.  She has had no  vomiting today, but presented to the office with a fever, with a temperature  of 103.7.  Fetal heart tones were documented.  The patient had a positive  Chem-9 with positive nitrites; she is therefore to be admitted for treatment  of pyelonephritis.  Pregnancy is remarkable for:  #1 - Late to care, #2 -  current pyelonephritis.   HISTORY OF PRESENT PREGNANCY:  The patient had her first visit at [facility] today.  She had a positive UPT at [facility] on August 29, 2003.  She has no history of UTIs.   ALLERGIES:  None.   MEDICATIONS:  Prenatal vitamins, Phenergan and Motrin; these were called in  on September 10, 2003 for nausea and low back pain.   PAST HISTORY:  Past history is remarkable for questionable last menstrual  period.  There is no significant gynecological history.  The patient has  never had any surgery and there is no history of hypertension, diabetes or  any other problems.   FAMILY HISTORY:  Family history is noncontributory.  There is no  hypertension, diabetes, thyroid problems or genetic issues.   SOCIAL HISTORY:  The patient is Caucasian.  She is divorced.  Her family is  supportive.  The patient is unemployed.  The patient is a nonsmoker.  She  denies any alcohol, drug or tobacco use during this pregnancy.   PHYSICAL EXAM:  VITAL SIGNS:  Temperature at the office was 103.7; here at  the hospital, 102.4.  Pulse at the office was 110; here at the hospital it  is 88.  Respirations are 20 and blood pressure is 120/60.  GENERAL APPEARANCE:  This is a well-developed white female who appears  moderately ill with flu-like symptoms.  HEENT:  Within normal limits.  LUNGS:  Bilateral breath sounds are clear.  HEART:  Regular rate and rhythm without murmur.  BREASTS:  Breasts are soft and nontender.  ABDOMEN:  Fundal height is  approximately 18-week-size.  Fetal heart tones  were 164 in the office.  Negative CVA tenderness is noted at this time.  There is no rebound or guarding noted on the abdominal exam.  Positive bowel  sounds are noted.  PELVIC:  External genitalia is remarkable for a normal female.  Vagina is  normal.  Cervix is closed and long.  Uterus is 18 weeks' size with positive  fetal heart tones.  Adnexa are nontender with no obvious masses noted.  RECTUM:  Deferred.  EXTREMITIES:  Extremities are normal.  SKIN:  Skin is warm and dry.   LABORATORY AND ACCESSORY CLINICAL DATA:  Clean-catch urine at the office  showed a Chem-9 of 2+ leukocytes, plus positive nitrites, 1+ ketones and 2+  blood.  GC and Chlamydia cultures were done in the office.   IMPRESSION:  1. Intrauterine pregnancy at approximately 18 weeks.  2. Late to care.  3. Pyelonephritis.   PLAN:  1. Admit to third floor per consult with Dr. Janine Anthony as attending     physician.  2. IV fluids of D5/LR 125 mL an hour.  3. Vital signs q.4 h.  4. Regular diet as tolerated.  5. CBC with differential.  6. Urine culture.  7. Blood cultures x2.  8. Ambulate when alert and stable.  9. Tylenol 1-2 p.o. q.6  h. p.r.n. temperature greater than 102.2.  10.      Prenatal vitamins p.o. daily.  11.      Ancef 1 g IV q.6 h.  12.      Ambien and Phenergan on p.r.n. basis.     Megan Anthony, C.N.M.                   Megan Anthony, M.D.    Megan Anthony  D:  09/12/2003  T:  09/12/2003  Job:  [id number]

## 2010-06-01 NOTE — Discharge Summary (Signed)
Megan Anthony, [name]:  [id number]   MEDICAL RECORD NO.:  [id number]          PATIENT TYPE:  INP   LOCATION:  9314                          [facility]ILITY[facility]  [facility]   PHYSICIAN:  Janine Limbo, M.D.DATE OF BIRTH:  1980/08/27   DATE OF ADMISSION:  01/23/2004  DATE OF DISCHARGE:                                 DISCHARGE SUMMARY   ADMISSION DIAGNOSES:  1.  Intrauterine pregnancy at 73 and six-sevenths weeks with twin gestation.  2.  Discordant growth.  3.  Unfavorable cervix.   DISCHARGE DIAGNOSES:  1.  Intrauterine pregnancy at 55 and six-sevenths weeks with twin gestation.  2.  Discordant growth.  3.  Unfavorable cervix.  4.  Status post primary low transverse cesarean section for delivery of two      viable female infants.  5.  Bottle feeding.  6.  Planning intrauterine device for contraception.   PROCEDURES THIS ADMISSION:  Primary low transverse cesarean section for  delivery of viable female infant A who weighed 3 pounds 9 ounces and had  Apgars of 9 and 9, named [place] and cord pH of 7.18.  Baby B was a viable  female infant weighed 5 pounds 2 ounces, at of 8 and 9, named [place].  Attended in delivery by Dr. Leonard Schwartz and Saverio Danker,  C.N.M.   HOSPITAL COURSE:  Megan Anthony is a 30 year old white female gravida 2 para 0-0-  1-0 at 44 and six-sevenths weeks with twin gestation with significant  discordant growth recently who had routine testing today with presenting  twin less than 5th percentile and second twin 50th to 75th percentile.  She  is admitted for close observation and probable induction.  The opportunity  for cesarean section was reviewed on admission.  However, the patient  decided to proceed with induction of labor and had Cervidil placed on the  afternoon of January 23, 2004.  She had the Cervidil discontinued at about 10  p.m. on January 23, 2004 and was started on Pitocin.  She was 3 cm at 9 a.m.  on January 24, 2004  and had artificial rupture of the membranes for clear  fluid and Pitocin was continued.  She, however, developed severe variables  as labor progressed and underwent a STAT cesarean section for delivery due  to the nonreassuring fetal heart rate tracing.  She was delivered of a  viable female infant, baby A, who weighed 3 pounds 9 ounces and had Apgars  of 9 and named [place], and a viable female infant, baby B, who weighed 5  pounds 2 ounces, had Apgars of 8 and 9, named Michelle.  Please see  operative note for further details.  Postoperatively, the patient is doing  well.  She is ambulating, voiding, and eating without difficulty. Her vital  signs are stable and she is afebrile.  She is planning to bottle feed.  Her  infants are stable in the NICU and she is deemed ready for discharge today.   DISCHARGE INSTRUCTIONS:  As per the [facility] OB/GYN handout.   DISCHARGE MEDICATIONS:  1.  Motrin 600  mg p.o. q.6h. p.r.n. for pain.  2.  Prenatal vitamins daily.  3.  Tylox one to two p.o. q.4-6h. p.r.n. for pain.   DISCHARGE LABORATORY DATA:  Her hemoglobin is 9.4; wbc count is 12.8;  platelets are 250,000.  Her chemistries on January 23, 2004 are all stable.   DISCHARGE FOLLOW-UP:  In 4-6 weeks at [facility] OB/GYN or p.r.n.  The  patient plans an IUD for contraception.   DISCHARGE STATUS:  Well and stable.     Shel   SJD/MEDQ  D:  01/27/2004  T:  01/27/2004  Job:  [id number]

## 2010-06-01 NOTE — H&P (Signed)
Megan Anthony, Megan Anthony               ACCOUNT NO.:  [id number]   MEDICAL RECORD NO.:  [id number]          PATIENT TYPE:  INP   LOCATION:  9175                          [facility]   PHYSICIAN:  Naima A. Dillard, M.D. DATE OF BIRTH:  21-Oct-1980   DATE OF ADMISSION:  01/23/2004  DATE OF DISCHARGE:                                HISTORY & PHYSICAL   HISTORY OF PRESENT ILLNESS:  This 30 year old gravida 2 para 0-0-1-0 at 53  and six-sevenths weeks with twin gestation who presented for routine testing  today and the presenting twin A was found to be less than 5 percentile for  growth with the second twin being 50-75 percentile growth.  Because of the  discordance, decision was made to induce labor today.  Pregnancy has been  followed by Dr. Stefano Gaul and remarkable for:  1.  Twins.  2.  Late to care.  3.  Second trimester pyelonephritis.  4.  History of pulmonary edema in the second trimester with pyelonephritis.   OBSTETRICAL HISTORY:  Remarkable for an elective abortion in 2003 in the  first trimester.   MEDICAL HISTORY:  Remarkable for childhood varicella.  History of  pyelonephritis in August for which she received IV antibiotics.  She did  develop pulmonary edema during that illness.   SURGICAL HISTORY:  Negative.   FAMILY HISTORY:  Remarkable for a mother with adult-onset diabetes,  grandmother with adult-onset diabetes, an aunt with kidney cancer, and a  mother with early breast cancer.   GENETIC HISTORY:  Unremarkable.   SOCIAL HISTORY:  The patient is single.  Father of the baby, Barbara Cower, is  somewhat involved.  She does not report a religious affiliation.  She denies  any alcohol, tobacco, or drug use.   PRENATAL LABORATORY DATA:  Hemoglobin 11.3, platelets 275.  Hepatitis  negative.  Rubella immune.  Blood type O positive, antibody screen negative.  RPR nonreactive.  Group B strep has not been done yet.  Gonorrhea and  chlamydia were negative.  Glucola was elevated;  3-hour GTT was normal.   HISTORY OF CURRENT PREGNANCY:  The patient entered care at [redacted] weeks  gestation.  Diagnosis of twins was made due to an early ultrasound which was  done for size greater than dates.  The patient did have an episode of  pyelonephritis with pulmonary edema which resolved with antibiotics and  hospitalization.  She had an ultrasound at 20 weeks which showed normal  anatomy on both twins.  Her cervix remained long and closed throughout the  pregnancy.  Ultrasound at 29 weeks showed BPP 8/8 on both twins, normal  Dopplers on both twins, and normal fluid in both twins.  Cervix remained  long and closed.  She presented today with discordant growth and is being  admitted for induction.   OBJECTIVE DATA:  VITAL SIGNS:  Stable, afebrile.  HEENT:  Within normal limits.  Thyroid normal, not enlarged.  CHEST:  Clear to auscultation.  HEART:  Regular rate and rhythm.  ABDOMEN:  Gravid.  Ultrasound on twin A shows vertex presentation with an  EFW of 2551,  normal amniotic fluid, and a heartbeat of 135.  Twin B is also  vertex with normal fluid, EFW of 1673, and heartbeat of 145.  PELVIC:  Cervix is long and closed.  EXTREMITIES:  Within normal limits.   ASSESSMENT:  1.  Intrauterine twin pregnancy at 94 and six-sevenths weeks.  2.  Discordant growth of twin A.   PLAN:  1.  Admit to birthing suites.  2.  Induction of labor is planned per Dr. Normand Sloop.  The patient was offered      an elective C-section and declined.  Risks and benefits of induction      were reviewed with the patient and she agrees to proceed.      Perinatologist, Dr. Gavin Potters, was also consulted and agreed with plan.  3.  Penicillin will be given when in active labor or if rupture of membranes      occurs for group B strep prophylaxis.  4.  NICU to be notified.      MLW/MEDQ  D:  01/23/2004  T:  01/23/2004  Job:  [id number]

## 2010-06-01 NOTE — Discharge Summary (Signed)
Megan Anthony, [name]:  [id number]   MEDICAL RECORD NO.:  [id number]          PATIENT TYPE:  INP   LOCATION:  9156                          [facility]ACILITY[facility]  [facility]   PHYSICIAN:  Janine Limbo, M.D.DATE OF BIRTH:  Jan 13, 1981   DATE OF ADMISSION:  11/28/2003  DATE OF DISCHARGE:  11/30/2003                                 DISCHARGE SUMMARY   ADMITTING DIAGNOSES:  1.  Intrauterine pregnancy at 27 weeks with twin gestation.  2.  Monochorionic-diamniotic twins with growth discrepancy twin B less than      twin A.  3.  Abnormal middle cerebral artery Dopplers on twin B with normal umbilical      artery Dopplers.  4.  Febrile illness, probable recurrent pyelonephritis.  5.  Preterm contractions.  6.  Proteinuria with no evidence of pregnancy-induced hypertension.   DISCHARGE DIAGNOSES:  1.  Intrauterine pregnancy at 27 weeks with twin gestation.  2.  Monochorionic-diamniotic twins with growth discrepancy twin B less than      twin A.  3.  Abnormal middle cerebral artery Dopplers on twin B with normal umbilical      artery Dopplers.  4.  Febrile illness, recurrent pyelonephritis.  5.  Preterm contractions stable, no cervical change, fetal fibronectin      negative.  6.  Proteinuria with no evidence of pregnancy-induced hypertension.   Megan Anthony is a 30 year old gravida 1 para 0 who presented at 27 weeks for  follow-up ultrasound for growth of twins.  The exam was significant for a  200 g discrepancy between the weights of twin A and twin B, and an abnormal  MCA Doppler on twin B.  The patient also was noted to have a fever with  temperature of 103.7.  She reported a fever x3 days with chills at home.  Her cervix on admission was a fingertip, 50%, and -2.  She had blood  cultures which on November 30, 2003 remained negative.  She had urine  culture which showed greater than 100,000 colonies of E. coli.  Her other  blood work:  CBC found a hemoglobin of 10.6,  hematocrit of 30.8.  She did  have a left shift significant for infection on the day of admission.  Her  chemistry showed a potassium of 3.2 on the day of admission.  Her IV line  was therefore run with addition of KCl.  Her 1-hour Glucola was elevated and  plans will be made for a 3-hour glucose tolerance test following discharge  from the hospital, approximately 1-2 weeks after the patient's dose of  betamethasone.  She was medicated with betamethasone 12.5 mg x2 on November  14 and November 29, 2003 for fetal lung maturity.  A fetal fibronectin on  November 14 was negative.  GC and chlamydia were negative at that time.  Ultrasound exam of twin B on November 15 showed normal MCA Doppler and a  normal AFI.  The patient continued contracting approximately 2-8 times per  hour.  The patient did not perceive these contractions and she is discharged  home on bedrest in stable condition.  She has been afebrile greater than 24  hours and has been medicated with IV Ancef during the course of her  admission.  As she has been afebrile greater than 24 hours she is given a  prescription for Keflex 250 mg p.o. q.i.d. x10 days and instructed on the  importance of completing this course of antibiotics.  She is to call for any  signs or symptoms of infection, any fever or chills.  She was instructed on  fetal kick counts and will call with any decreased fetal movement.  The  signs and symptoms of preterm labor were again reviewed with the patient per  discharge information, and she is instructed to call for any signs or  symptoms of preterm labor.  The signs and symptoms of PIH/preeclampsia were  also reviewed with the patient and she will call for any headache, visual  changes, or epigastric pain, any unexplained increased swelling, or any  problems or concerns.  She was given a handout and instructed on level 3  bedrest.  The patient and her mother both were present at the time of  discharge  instructions and verbalized her understanding.  She will call the  office of [facility] tomorrow for a detailed outline of ultrasound appointments.  She is to be seen at [facility] for  ultrasound twice weekly with BPP.  At one of those ultrasound appointments  she is to have measurements for AFI and uterine artery Doppler studies.  She  is to have ultrasound for growth every third week as outlined per  instructions by Dr. Dierdre Forth.     Megan Anthony   SDM/MEDQ  D:  12/01/2003  T:  12/01/2003  Job:  [id number]

## 2010-06-01 NOTE — Discharge Summary (Signed)
NAMEHAROLD, MATTES NO.:  [id number]   MEDICAL RECORD NO.:  [id number]                   PATIENT TYPE:  INP   LOCATION:  9311                                 [facility]ILITY[facility]   PHYSICIAN:  Janine Limbo, M.D.            DATE OF BIRTH:  12/10/1980   DATE OF ADMISSION:  09/12/2003  DATE OF DISCHARGE:  09/18/2003                                 DISCHARGE SUMMARY   ADMISSION DIAGNOSIS:  Intrauterine pregnancy at 17 weeks with twin  gestation, maternal fever with probable pyelonephritis, pulmonary edema.   DISCHARGE DIAGNOSIS:  Intrauterine pregnancy at 17 weeks with twin  gestation, pyelonephritis, pulmonary edema, improved, anemia.    Megan Anthony is a 30 year old gravida 1, para 0, who presented at approximately  [redacted] weeks gestation for her first prenatal visit with complaint of fever and  low back pain.  She was admitted to [facility] [place] with a  temperature of 103.1 and probable pyelonephritis.  Urine culture, blood  cultures, cultures for Gonorrhea and Chlamydia were all obtained.  Urine  culture showed finding of greater than 100,000 colonies per mL of E. coli.  Blood cultures were found to be negative.  White blood cell count on September 12, 2003, was 21.5 with a left shift of 92 neutrophils.  White blood cell  count came down to 9.1 on September 15, 2003.  The patient is found to be  anemic with hemoglobin of 9.3.  She was begun on IV antibiotics for  pyelonephritis and it was noted that her O2 saturation was 82%.  She was not  short of breath at the time, however, chest x-ray found moderate pulmonary  edema.  She was treated with Lasix with improvement and a pulmonary consult  was arranged.  The patient had a 2D echocardiogram which was found to be  normal.  The patient improved with IV antibiotics and antiemetics.  She  received a social work consult and has support for herself and her family.  On September 18, 2003, her vital  signs were stable, she has been afebrile for  greater than 24 hours, her lungs are clear and slightly diminished at the  bases, she is up and ambulating without any difficulty and without any  shortness of breath.  Her O2 saturation is 97-98% on room air.  She no  longer has any nausea and vomiting and is taking p.o. quite well.  In light  of the above findings, the patient is deemed to be stable and in  satisfactory condition for discharge.  Discharge instructions were discussed  with the patient and she will call for any difficulty breathing, pain,  fever, or any vaginal bleeding or cramping.  Her next scheduled appointment  at the office of [facility] is on September 14 and she will keep that as  scheduled.  She was given a prescription for Septra DS to continue 1 p.o.  b.i.d. for four days.     Rica Koyanagi, C.N.M.               Janine Limbo, M.D.    SDM/MEDQ  D:  09/18/2003  T:  09/19/2003  Job:  [id number]

## 2010-08-21 ENCOUNTER — Encounter: Payer: Self-pay | Admitting: *Deleted

## 2010-08-21 DIAGNOSIS — N760 Acute vaginitis: Secondary | ICD-10-CM | POA: Insufficient documentation

## 2010-08-21 DIAGNOSIS — A499 Bacterial infection, unspecified: Secondary | ICD-10-CM | POA: Insufficient documentation

## 2010-08-21 DIAGNOSIS — B9689 Other specified bacterial agents as the cause of diseases classified elsewhere: Secondary | ICD-10-CM | POA: Insufficient documentation

## 2010-08-21 NOTE — ED Notes (Signed)
Patient with vaginal discharge x 2 weeks, +unprotected sex with one partner

## 2010-08-22 ENCOUNTER — Emergency Department (HOSPITAL_COMMUNITY)
Admission: EM | Admit: 2010-08-22 | Discharge: 2010-08-22 | Disposition: A | Discharge status: Home / Self Care | Payer: Self-pay | Attending: Emergency Medicine | Admitting: Emergency Medicine

## 2010-08-22 DIAGNOSIS — N898 Other specified noninflammatory disorders of vagina: Secondary | ICD-10-CM

## 2010-08-22 DIAGNOSIS — N760 Acute vaginitis: Secondary | ICD-10-CM

## 2010-08-22 LAB — URINE MICROSCOPIC-ADD ON

## 2010-08-22 LAB — WET PREP, GENITAL
Trich, Wet Prep: NONE SEEN
Yeast Wet Prep HPF POC: NONE SEEN

## 2010-08-22 LAB — URINALYSIS, ROUTINE W REFLEX MICROSCOPIC
Bilirubin Urine: NEGATIVE
Nitrite: NEGATIVE
Urobilinogen, UA: 0.2 mg/dL (ref 0.0–1.0)

## 2010-08-22 MED ORDER — METRONIDAZOLE 500 MG PO TABS: 500.0000 mg | ORAL_TABLET | Freq: Two times a day (BID) | ORAL | Status: AC

## 2010-08-22 NOTE — ED Provider Notes (Signed)
History     CSN: [id number] Arrival date & time: 08/22/2010 12:45 AM  Chief Complaint  Patient presents with  . Vaginal Discharge    with odor   HPI Comments: Patient with several days of vaginal discharge which has a foul order. She has associated dysuria. Symptoms are mild, constant, nothing makes better or worse. She states that she has one sexual partner has never had a sexual transmitted disease. She has had 2 episodes of bacterial vaginosis in the last year. She denies abdominal pain, nausea, fever.  Patient is a 30 y.o. female presenting with vaginal discharge. The history is provided by the patient and a friend.  Vaginal Discharge Pertinent negatives include no abdominal pain.    History reviewed. No pertinent past medical history.  Past Surgical History  Procedure Date  . Cesarean section   . Tubal ligation     History reviewed. No pertinent family history.  History  Substance Use Topics  . Smoking status: Never Smoker   . Smokeless tobacco: Not on file  . Alcohol Use: Yes     occ. use    OB History    Grav Para Term Preterm Abortions TAB SAB Ect Mult Living                  Review of Systems  Constitutional: Negative for fever.  Gastrointestinal: Negative for abdominal pain.  Genitourinary: Positive for dysuria and vaginal discharge.    Physical Exam  BP 110/67  Pulse 77  Temp(Src) 97.9 F (36.6 C) (Oral)  Resp 18  Ht 5\' 4"  (1.626 m)  Wt 150 lb (68.04 kg)  BMI 25.75 kg/m2  SpO2 100%  LMP 08/11/2010  Physical Exam  Nursing note and vitals reviewed. Constitutional: She appears well-developed and well-nourished. No distress.  HENT:  Head: Normocephalic and atraumatic.  Mouth/Throat: Oropharynx is clear and moist. No oropharyngeal exudate.  Eyes: Conjunctivae and EOM are normal. Pupils are equal, round, and reactive to light. Right eye exhibits no discharge. Left eye exhibits no discharge. No scleral icterus.  Neck: Normal range of motion. Neck  supple. No JVD present. No thyromegaly present.  Cardiovascular: Normal rate, regular rhythm, normal heart sounds and intact distal pulses.  Exam reveals no gallop and no friction rub.   No murmur heard. Pulmonary/Chest: Effort normal and breath sounds normal. No respiratory distress. She has no wheezes. She has no rales.  Abdominal: Soft. Bowel sounds are normal. She exhibits no distension and no mass. There is no tenderness.  Genitourinary:       Pelvic exam reveals minimal vaginal discharge, no cervical motion tenderness, adnexal masses or tenderness. No vaginal bleeding  Chaperone present for exam  Musculoskeletal: Normal range of motion. She exhibits no edema and no tenderness.  Lymphadenopathy:    She has no cervical adenopathy.  Neurological: She is alert. Coordination normal.  Skin: Skin is warm and dry. No rash noted. No erythema.  Psychiatric: She has a normal mood and affect. Her behavior is normal.    ED Course  Procedures  MDM Overall exam unremarkable. Urinalysis reveals no signs of urinary tract infection, vital signs normal, no significant vaginal infections. Patient informed that gonorrhea and Chlamydia results may be delayed. Doubt that diagnosis given her exam. Will refer back to her family physician.      Vida Roller, MD 08/22/10 [phone]

## 2010-08-23 LAB — GC/CHLAMYDIA PROBE AMP, GENITAL
Chlamydia, DNA Probe: NEGATIVE
GC Probe Amp, Genital: NEGATIVE

## 2010-10-06 ENCOUNTER — Emergency Department (HOSPITAL_COMMUNITY)
Admission: EM | Admit: 2010-10-06 | Discharge: 2010-10-06 | Disposition: A | Discharge status: Home / Self Care | Payer: Self-pay | Attending: Emergency Medicine | Admitting: Emergency Medicine

## 2010-10-06 ENCOUNTER — Encounter (HOSPITAL_COMMUNITY): Payer: Self-pay

## 2010-10-06 DIAGNOSIS — N760 Acute vaginitis: Secondary | ICD-10-CM | POA: Insufficient documentation

## 2010-10-06 DIAGNOSIS — J029 Acute pharyngitis, unspecified: Secondary | ICD-10-CM | POA: Insufficient documentation

## 2010-10-06 DIAGNOSIS — N390 Urinary tract infection, site not specified: Secondary | ICD-10-CM | POA: Insufficient documentation

## 2010-10-06 DIAGNOSIS — R509 Fever, unspecified: Secondary | ICD-10-CM | POA: Insufficient documentation

## 2010-10-06 LAB — URINALYSIS, ROUTINE W REFLEX MICROSCOPIC
Ketones, ur: NEGATIVE mg/dL
Protein, ur: NEGATIVE mg/dL
pH: 6 (ref 5.0–8.0)

## 2010-10-06 LAB — URINE MICROSCOPIC-ADD ON

## 2010-10-06 LAB — WET PREP, GENITAL

## 2010-10-06 MED ORDER — CEPHALEXIN 500 MG PO CAPS: 500.0000 mg | ORAL_CAPSULE | Freq: Four times a day (QID) | ORAL | Status: AC

## 2010-10-06 MED ORDER — METRONIDAZOLE 0.75 % VA GEL: 1.0000 | Freq: Every day | VAGINAL | Status: AC

## 2010-10-06 MED ORDER — IBUPROFEN 400 MG PO TABS
400.0000 mg | ORAL_TABLET | Freq: Once | ORAL | Status: AC
  Administered 2010-10-06: 400 mg via ORAL
  Filled 2010-10-06: qty 1

## 2010-10-06 MED ORDER — ACETAMINOPHEN 500 MG PO TABS
1000.0000 mg | ORAL_TABLET | Freq: Once | ORAL | Status: AC
  Administered 2010-10-06: 1000 mg via ORAL
  Filled 2010-10-06: qty 2

## 2010-10-06 NOTE — ED Notes (Signed)
Pt presents with fever and sore throat for 2 days and foul smelling vaginal discharge x 1 week. Pt states she has been taking Motrin for fever. Last dose 0500.

## 2010-10-06 NOTE — ED Provider Notes (Signed)
History     CSN: [id number] Arrival date & time: 10/06/2010 10:04 AM  Chief Complaint  Patient presents with  . Sore Throat  . Fever  . Vaginal Discharge    HPI Pt was seen at 1040.  Per pt, c/o gradual onset and persistence of constant sore throat, runny/stuffy nose and subjective home fevers x2 days.  Pt also c/o gradual onset and persistence of constant vaginal discharge x1 week.  States she has been seen in ED last month for vaginal discharge, symptoms initially improved but returned.  Denies abd/pelvic pain, no N/V/D, no rash, no CP/SOB, no cough.     History reviewed. No pertinent past medical history.  Past Surgical History  Procedure Date  . Cesarean section   . Tubal ligation       History  Substance Use Topics  . Smoking status: Never Smoker   . Smokeless tobacco: Not on file  . Alcohol Use: Yes     occ. use    Review of Systems  Review of Systems ROS: Statement: All systems negative except as marked or noted in the HPI; Constitutional: Positive for fever and chills. ; ; Eyes: Negative for eye pain, redness and discharge. ; ; ENMT: Negative for ear pain, hoarseness, +runny/stuffy nose, nasal congestion, sinus pressure and sore throat.; ; Cardiovascular: Negative for chest pain, palpitations, diaphoresis, dyspnea and peripheral edema. ; ; Respiratory: Negative for cough, wheezing and stridor. ; ; Gastrointestinal: Negative for nausea, vomiting, diarrhea and abdominal pain, blood in stool, hematemesis, jaundice and rectal bleeding. . ; ; Genitourinary: +dysuria, vaginal discharge.  Negative for flank pain and hematuria. ; ; Musculoskeletal: Negative for back pain and neck pain. Negative for swelling and trauma.; ; Skin: Negative for pruritus, rash, abrasions, blisters, bruising and skin lesion.; ; Neuro: Negative for headache, lightheadedness and neck stiffness. Negative for weakness, altered level of consciousness , altered mental status, extremity weakness, paresthesias,  involuntary movement, seizure and syncope.      Physical Exam    BP 124/75  Pulse 140  Temp(Src) 102.6 F (39.2 C) (Oral)  Resp 20  Ht 5\' 4"  (1.626 m)  Wt 150 lb (68.04 kg)  BMI 25.75 kg/m2  SpO2 100%  LMP 09/10/2010  Physical Exam 1045: Physical examination:  Nursing notes reviewed; Vital signs and O2 SAT reviewed;  Constitutional: Well developed, Well nourished, Well hydrated, In no acute distress; Head:  Normocephalic, atraumatic; Eyes: EOMI, PERRL, No scleral icterus; ENMT: TM's without erythema bilat. Post pharynx without erythema or exudates, no intra-oral edema.  +edemetous nasal turbinates bilat with clear rhinorrhea. Mouth and pharynx normal, Mucous membranes moist; Neck: Supple, Full range of motion, No lymphadenopathy, no meningeal signs; Cardiovascular: Regular rate and rhythm, No murmur, rub, or gallop; Respiratory: Breath sounds clear & equal bilaterally, No rales, rhonchi, wheezes, or rub, Normal respiratory effort/excursion, speaking full sentences with ease; Chest: Nontender, Movement normal; Abdomen: Soft, Nontender, Nondistended, Normal bowel sounds; Pelvic exam performed with permission of pt and female ED RN assist during exam.  External genitalia w/o lesions. Vaginal vault with thick white discharge.  Cervix w/o lesions, not friable, GC/chlam and wet prep obtained and sent to lab.  Bimanual exam w/o CMT, uterine or adenexal tenderness. Extremities: Pulses normal, No tenderness, No edema, No calf edema or asymmetry.; Neuro: AA&Ox3, Major CN grossly intact.  No gross focal motor or sensory deficits in extremities.; Skin: Color normal, Warm, Dry, no rash, no petechiae    ED Course  Procedures   MDM MDM Reviewed: nursing  note and vitals Reviewed previous: labs Interpretation: labs   08/2010 with neg GC/chlam probe.  Results for orders placed during the hospital encounter of 10/06/10  RAPID STREP SCREEN      Component Value Range   Streptococcus, Group A Screen  (Direct) NEGATIVE  NEGATIVE   URINALYSIS, ROUTINE W REFLEX MICROSCOPIC      Component Value Range   Color, Urine YELLOW  YELLOW    Appearance HAZY (*) CLEAR    Specific Gravity, Urine 1.025  1.005 - 1.030    pH 6.0  5.0 - 8.0    Glucose, UA NEGATIVE  NEGATIVE (mg/dL)   Hgb urine dipstick SMALL (*) NEGATIVE    Bilirubin Urine NEGATIVE  NEGATIVE    Ketones, ur NEGATIVE  NEGATIVE (mg/dL)   Protein, ur NEGATIVE  NEGATIVE (mg/dL)   Urobilinogen, UA 0.2  0.0 - 1.0 (mg/dL)   Nitrite NEGATIVE  NEGATIVE    Leukocytes, UA NEGATIVE  NEGATIVE   WET PREP, GENITAL      Component Value Range   Yeast, Wet Prep NONE SEEN  NONE SEEN    Trich, Wet Prep NONE SEEN  NONE SEEN    Clue Cells, Wet Prep FEW (*) NONE SEEN    WBC, Wet Prep HPF POC FEW (*) NONE SEEN   POCT PREGNANCY, URINE      Component Value Range   Preg Test, Ur NEGATIVE    POCT PREGNANCY, URINE      Component Value Range   Preg Test, Ur NEGATIVE    URINE MICROSCOPIC-ADD ON      Component Value Range   Squamous Epithelial / LPF MANY (*) RARE    WBC, UA 3-6  <3 (WBC/hpf)   RBC / HPF 7-10  <3 (RBC/hpf)   Bacteria, UA MANY (*) RARE    2:05 PM:  Fever improved after tylenol and motrin.  Strep neg.  +BV, GC/chlam pending.  UAdip likely contaminated, but does c/o dysuria, with tx.  Wants to go home now.  Dx testing d/w pt.  Questions answered.  Verb understanding, agreeable to d/c home with outpt f/u.        Medications given in ED:    acetaminophen (TYLENOL) tablet 1,000 mg (1000 mg Oral Given 10/06/10 1109)  ibuprofen (ADVIL,MOTRIN) tablet 400 mg (400 mg Oral Given 10/06/10 1109)   New Prescriptions   CEPHALEXIN (KEFLEX) 500 MG CAPSULE    Take 1 capsule (500 mg total) by mouth 4 (four) times daily.   METRONIDAZOLE (METROGEL VAGINAL) 0.75 % VAGINAL GEL    Place 1 Applicatorful vaginally daily. x5 days      [facility] Allison Quarry, DO 10/06/10 2051

## 2010-10-08 LAB — POCT PREGNANCY, URINE: Preg Test, Ur: NEGATIVE

## 2010-10-09 LAB — GC/CHLAMYDIA PROBE AMP, GENITAL: GC Probe Amp, Genital: POSITIVE — AB

## 2010-10-11 NOTE — ED Notes (Addendum)
+   gonorrhea Chart sent to EDP office for review.Patient treated with Keflex

## 2010-10-16 LAB — URINALYSIS, ROUTINE W REFLEX MICROSCOPIC
Leukocytes, UA: NEGATIVE
Nitrite: NEGATIVE
Specific Gravity, Urine: >1.03 — ABNORMAL HIGH
Urobilinogen, UA: 0.2

## 2010-10-16 LAB — URINE MICROSCOPIC-ADD ON

## 2010-10-16 LAB — PREGNANCY, URINE: Preg Test, Ur: NEGATIVE

## 2010-10-17 LAB — URINALYSIS, ROUTINE W REFLEX MICROSCOPIC
Glucose, UA: NEGATIVE
Ketones, ur: NEGATIVE
pH: 8

## 2010-10-17 LAB — GC/CHLAMYDIA PROBE AMP, GENITAL
Chlamydia, DNA Probe: NEGATIVE
GC Probe Amp, Genital: NEGATIVE

## 2010-10-17 LAB — URINE CULTURE

## 2010-10-17 LAB — WET PREP, GENITAL

## 2010-10-17 LAB — URINE MICROSCOPIC-ADD ON

## 2014-12-13 ENCOUNTER — Emergency Department (HOSPITAL_COMMUNITY)
Admission: EM | Admit: 2014-12-13 | Discharge: 2014-12-13 | Disposition: A | Discharge status: Home / Self Care | Payer: Self-pay | Attending: Emergency Medicine | Admitting: Emergency Medicine

## 2014-12-13 ENCOUNTER — Encounter (HOSPITAL_COMMUNITY): Payer: Self-pay | Admitting: *Deleted

## 2014-12-13 DIAGNOSIS — Z3202 Encounter for pregnancy test, result negative: Secondary | ICD-10-CM | POA: Insufficient documentation

## 2014-12-13 DIAGNOSIS — N760 Acute vaginitis: Secondary | ICD-10-CM | POA: Insufficient documentation

## 2014-12-13 DIAGNOSIS — Z9851 Tubal ligation status: Secondary | ICD-10-CM | POA: Insufficient documentation

## 2014-12-13 LAB — URINALYSIS, ROUTINE W REFLEX MICROSCOPIC
Bilirubin Urine: NEGATIVE
GLUCOSE, UA: NEGATIVE mg/dL
KETONES UR: NEGATIVE mg/dL
LEUKOCYTES UA: NEGATIVE
NITRITE: NEGATIVE
PH: 6 (ref 5.0–8.0)
Specific Gravity, Urine: >1.03 — ABNORMAL HIGH (ref 1.005–1.030)

## 2014-12-13 LAB — WET PREP, GENITAL
CLUE CELLS WET PREP: NONE SEEN
Sperm: NONE SEEN
TRICH WET PREP: NONE SEEN
YEAST WET PREP: NONE SEEN

## 2014-12-13 LAB — URINE MICROSCOPIC-ADD ON

## 2014-12-13 LAB — HCG, SERUM, QUALITATIVE: Preg, Serum: NEGATIVE

## 2014-12-13 MED ORDER — FLUCONAZOLE 100 MG PO TABS
150.0000 mg | ORAL_TABLET | Freq: Once | ORAL | Status: AC
  Administered 2014-12-13: 150 mg via ORAL
  Filled 2014-12-13: qty 2

## 2014-12-13 MED ORDER — FLUCONAZOLE 150 MG PO TABS: 150.0000 mg | ORAL_TABLET | Freq: Every day | ORAL | Status: AC

## 2014-12-13 NOTE — ED Notes (Signed)
Pt reporting using monistat on Saturday.  Reports that since that time she has felt vaginal area is swollen and painful.

## 2014-12-13 NOTE — ED Provider Notes (Signed)
CSN: [id number]     Arrival date & time 12/13/14  2003 History   First MD Initiated Contact with Patient 12/13/14 2026     No chief complaint on file.     HPI  Patient presents for evaluation of vaginal burning. Had some curdy discharge and used over-the-counter Monistat intravaginally Saturday and Sunday. He said some redness and burning since that time. No blistering. Has had less discharge. No bleeding. No abdominal pain.  History reviewed. No pertinent past medical history. Past Surgical History  Procedure Laterality Date  . Cesarean section    . Tubal ligation     History reviewed. No pertinent family history. Social History  Substance Use Topics  . Smoking status: Never Smoker   . Smokeless tobacco: None  . Alcohol Use: Yes     Comment: occ. use   OB History    No data available     Review of Systems  Constitutional: Negative for fever, chills, diaphoresis, appetite change and fatigue.  HENT: Negative for mouth sores, sore throat and trouble swallowing.   Eyes: Negative for visual disturbance.  Respiratory: Negative for cough, chest tightness, shortness of breath and wheezing.   Cardiovascular: Negative for chest pain.  Gastrointestinal: Negative for nausea, vomiting, abdominal pain, diarrhea and abdominal distention.  Endocrine: Negative for polydipsia, polyphagia and polyuria.  Genitourinary: Positive for vaginal discharge. Negative for dysuria, frequency and hematuria.  Musculoskeletal: Negative for gait problem.  Skin: Negative for color change, pallor and rash.  Neurological: Negative for dizziness, syncope, light-headedness and headaches.  Hematological: Does not bruise/bleed easily.  Psychiatric/Behavioral: Negative for behavioral problems and confusion.      Allergies  Review of patient's allergies indicates no known allergies.  Home Medications   Prior to Admission medications   Medication Sig Start Date End Date Taking? Authorizing Provider    fluconazole (DIFLUCAN) 150 MG tablet Take 1 tablet (150 mg total) by mouth daily. 12/13/14 12/20/14  Rolland Porter, MD  ibuprofen (ADVIL,MOTRIN) 200 MG tablet Take 600 mg by mouth every 6 (six) hours as needed. For pain     Historical Provider, MD   BP 119/66 mmHg  Pulse 74  Temp(Src) 98 F (36.7 C) (Oral)  Resp 20  Ht  (1.6 m)  Wt 145 lb (65.772 kg)  BMI 25.69 kg/m2  SpO2 100%  LMP 12/06/2014 Physical Exam  Constitutional: She is oriented to person, place, and time. She appears well-developed and well-nourished. No distress.  HENT:  Head: Normocephalic.  Eyes: Conjunctivae are normal. Pupils are equal, round, and reactive to light. No scleral icterus.  Neck: Normal range of motion. Neck supple. No thyromegaly present.  Cardiovascular: Normal rate and regular rhythm.  Exam reveals no gallop and no friction rub.   No murmur heard. Pulmonary/Chest: Effort normal and breath sounds normal. No respiratory distress. She has no wheezes. She has no rales.  Abdominal: Soft. Bowel sounds are normal. She exhibits no distension. There is no tenderness. There is no rebound.  Genitourinary:     Musculoskeletal: Normal range of motion.  Neurological: She is alert and oriented to person, place, and time.  Skin: Skin is warm and dry. No rash noted.  Psychiatric: She has a normal mood and affect. Her behavior is normal.    ED Course  Procedures (including critical care time) Labs Review Labs Reviewed  WET PREP, GENITAL - Abnormal; Notable for the following:    WBC, Wet Prep HPF POC MODERATE (*)    All other components within normal  limits  URINALYSIS, ROUTINE W REFLEX MICROSCOPIC (NOT AT [facility]) - Abnormal; Notable for the following:    Specific Gravity, Urine >1.030 (*)    Hgb urine dipstick TRACE (*)    Protein, ur TRACE (*)    All other components within normal limits  URINE MICROSCOPIC-ADD ON - Abnormal; Notable for the following:    Squamous Epithelial / LPF 0-5 (*)    Bacteria, UA  FEW (*)    All other components within normal limits  HCG, SERUM, QUALITATIVE  GC/CHLAMYDIA PROBE AMP ([facility]) NOT AT [facility]    Imaging Review No results found. I have personally reviewed and evaluated these images and lab results as part of my medical decision-making.   EKG Interpretation None      MDM   Final diagnoses:  Vaginitis    Still with a curd-like discharge although no yeast on swab. Given single-dose Diflucan. GYN follow-up. If reaction to gentle use of encouraged her to not use this again.    Rolland PorterMark Mauricia Mertens, MD 12/13/14 2151

## 2014-12-13 NOTE — Discharge Instructions (Signed)

## 2014-12-15 LAB — GC/CHLAMYDIA PROBE AMP (~~LOC~~) NOT AT ARMC
CHLAMYDIA, DNA PROBE: NEGATIVE
Neisseria Gonorrhea: NEGATIVE

## 2023-07-16 ENCOUNTER — Other Ambulatory Visit: Payer: Self-pay | Admitting: Internal Medicine

## 2023-07-16 DIAGNOSIS — Z0000 Encounter for general adult medical examination without abnormal findings: Secondary | ICD-10-CM

## 2023-07-17 ENCOUNTER — Ambulatory Visit
Admission: RE | Admit: 2023-07-17 | Discharge: 2023-07-17 | Disposition: A | Discharge status: Home / Self Care | Source: Ambulatory Visit | Attending: Internal Medicine | Admitting: Internal Medicine

## 2023-07-17 DIAGNOSIS — Z0000 Encounter for general adult medical examination without abnormal findings: Secondary | ICD-10-CM

## 2023-09-09 ENCOUNTER — Ambulatory Visit: Admitting: Obstetrics and Gynecology

## 2023-09-09 ENCOUNTER — Encounter: Payer: Self-pay | Admitting: Obstetrics and Gynecology

## 2023-09-09 ENCOUNTER — Other Ambulatory Visit (HOSPITAL_COMMUNITY)
Admission: RE | Admit: 2023-09-09 | Discharge: 2023-09-09 | Disposition: A | Discharge status: Home / Self Care | Source: Ambulatory Visit | Attending: Obstetrics and Gynecology | Admitting: Obstetrics and Gynecology

## 2023-09-09 VITALS — BP 109/74 | HR 61 | Ht 62.0 in | Wt 178.8 lb

## 2023-09-09 DIAGNOSIS — Z0000 Encounter for general adult medical examination without abnormal findings: Secondary | ICD-10-CM | POA: Insufficient documentation

## 2023-09-09 NOTE — Progress Notes (Signed)
 ANNUAL EXAM Patient name: Megan Anthony MRN [id number]  Date of birth: [birth year] Chief Complaint:   No chief complaint on file.  History of Present Illness:   Megan Anthony is a 43 y.o. [phone] being seen today for a routine annual exam.  Current complaints: annual  Menstrual concerns?   No Breast or nipple changes? No  Contraception use? S/p TL Sexually active? Yes   Patient's last menstrual period was 08/29/2023 (approximate).   The pregnancy intention screening data noted above was reviewed. Potential methods of contraception were discussed. The patient elected to proceed with No data recorded.   Last pap No results found for: DIAGPAP, HPVHIGH, ADEQPAP  Last mammogram: 07/2023 BIRADS 1.  Last colonoscopy: N/A.      09/09/2023    3:09 PM  Depression screen PHQ 2/9  Decreased Interest 1  Down, Depressed, Hopeless 0  PHQ - 2 Score 1  Altered sleeping 0  Tired, decreased energy 1  Change in appetite 0  Feeling bad or failure about yourself  0  Trouble concentrating 0  Moving slowly or fidgety/restless 0  Suicidal thoughts 0  PHQ-9 Score 2        09/09/2023    3:10 PM  GAD 7 : Generalized Anxiety Score  Nervous, Anxious, on Edge 0  Control/stop worrying 0  Worry too much - different things 0  Trouble relaxing 0  Restless 0  Easily annoyed or irritable 0  Afraid - awful might happen 0  Total GAD 7 Score 0     Review of Systems:   Pertinent items are noted in HPI Denies any headaches, blurred vision, fatigue, shortness of breath, chest pain, abdominal pain, abnormal vaginal discharge/itching/odor/irritation, problems with periods, bowel movements, urination, or intercourse unless otherwise stated above. Pertinent History Reviewed:  Reviewed past medical,surgical, social and family history.  Reviewed problem list, medications and allergies. Physical Assessment:   Vitals:   09/09/23 1500  BP: 109/74  Pulse: 61  Weight: 178 lb 12.8 oz (81.1 kg)   Height: 5' 2 (1.575 m)  Body mass index is 32.7 kg/m.        Physical Examination:   General appearance - well appearing, and in no distress  Mental status - alert, oriented to person, place, and time  Psych:  She has a normal mood and affect  Skin - warm and dry, normal color, no suspicious lesions noted  Chest - effort normal, all lung fields clear to auscultation bilaterally  Heart - normal rate and regular rhythm  Breasts - breasts appear normal, no suspicious masses, no skin or nipple changes or  axillary nodes  Abdomen - soft, nontender, nondistended, no masses or organomegaly  Pelvic -  VULVA: normal appearing vulva with no masses, tenderness or lesions   VAGINA: normal appearing vagina with normal color and discharge, no lesions   CERVIX: normal appearing cervix without discharge or lesions, no CMT  Thin prep pap is done with HR HPV cotesting  UTERUS: uterus is felt to be normal size, shape, consistency and nontender   ADNEXA: No adnexal masses or tenderness noted.  Extremities:  No swelling or varicosities noted  Chaperone present for exam  No results found for this or any previous visit (from the past 24 hours).    Assessment & Plan:  1. Annual physical exam (Primary) - Cervical cancer screening: Discussed guidelines. Pap with HPV collected - STD Testing: accepts - Birth Control: Discussed options and their risks, benefits and common side effects; discussed  VTE with estrogen containing options. Desires: prior TL - Breast Health: Encouraged self breast awareness/SBE. Teaching provided. Discussed limits of clinical breast exam for detecting breast cancer. MXR is up to date: 07/2023 - F/U 12 months and prn   Orders Placed This Encounter  Procedures   Hepatitis B Surface AntiGEN   Hepatitis C Antibody   HIV antibody (with reflex)   RPR    Meds: No orders of the defined types were placed in this encounter.   Follow-up: No follow-ups on file.  Carter Quarry,  MD 09/09/2023 3:48 PM

## 2023-09-09 NOTE — Progress Notes (Signed)
 Here for annual.  Mammogram on 07/23/2023.

## 2023-09-10 ENCOUNTER — Ambulatory Visit: Payer: Self-pay | Admitting: Obstetrics and Gynecology

## 2023-09-10 DIAGNOSIS — A599 Trichomoniasis, unspecified: Secondary | ICD-10-CM

## 2023-09-10 DIAGNOSIS — B9689 Other specified bacterial agents as the cause of diseases classified elsewhere: Secondary | ICD-10-CM

## 2023-09-10 LAB — CERVICOVAGINAL ANCILLARY ONLY
Bacterial Vaginitis (gardnerella): POSITIVE — AB
Candida Glabrata: NEGATIVE
Candida Vaginitis: NEGATIVE
Chlamydia: NEGATIVE
Comment: NEGATIVE
Comment: NEGATIVE
Comment: NEGATIVE
Comment: NEGATIVE
Comment: NEGATIVE
Comment: NORMAL
Neisseria Gonorrhea: NEGATIVE
Trichomonas: POSITIVE — AB

## 2023-09-10 LAB — HIV ANTIBODY (ROUTINE TESTING W REFLEX): HIV Screen 4th Generation wRfx: NONREACTIVE

## 2023-09-10 LAB — HEPATITIS C ANTIBODY: Hep C Virus Ab: NONREACTIVE

## 2023-09-10 LAB — HEPATITIS B SURFACE ANTIGEN: Hepatitis B Surface Ag: NEGATIVE

## 2023-09-10 LAB — SYPHILIS: RPR W/REFLEX TO RPR TITER AND TREPONEMAL ANTIBODIES, TRADITIONAL SCREENING AND DIAGNOSIS ALGORITHM: RPR Ser Ql: NONREACTIVE

## 2023-09-11 LAB — CYTOLOGY - PAP
Comment: NEGATIVE
Diagnosis: NEGATIVE
Diagnosis: REACTIVE
High risk HPV: NEGATIVE

## 2023-09-12 MED ORDER — METRONIDAZOLE 500 MG PO TABS: 500.0000 mg | ORAL_TABLET | Freq: Two times a day (BID) | ORAL | 1 refills | Status: AC

## 2023-12-22 ENCOUNTER — Ambulatory Visit

## 2023-12-23 ENCOUNTER — Ambulatory Visit

## 2023-12-23 ENCOUNTER — Other Ambulatory Visit (HOSPITAL_COMMUNITY)
Admission: RE | Admit: 2023-12-23 | Discharge: 2023-12-23 | Disposition: A | Discharge status: Home / Self Care | Source: Ambulatory Visit | Attending: Obstetrics | Admitting: Obstetrics

## 2023-12-23 VITALS — BP 113/72 | HR 64 | Wt 170.5 lb

## 2023-12-23 DIAGNOSIS — N898 Other specified noninflammatory disorders of vagina: Secondary | ICD-10-CM | POA: Diagnosis not present

## 2023-12-23 NOTE — Progress Notes (Signed)
..  SUBJECTIVE:  43 y.o. female complains of vaginal discharge and odor for a few week(s). Denies abnormal vaginal bleeding or significant pelvic pain or fever. No UTI symptoms. Denies history of known exposure to STD. Pt states that she previously completed the antibiotics to treat Trich  No LMP recorded.  OBJECTIVE:  She appears well, afebrile. Urine dipstick: not done.  ASSESSMENT:  Vaginal Discharge  Vaginal Odor   PLAN:  GC, chlamydia, trichomonas, BVAG, CVAG probe sent to lab. Treatment: To be determined once lab results are received ROV prn if symptoms persist or worsen.

## 2023-12-24 ENCOUNTER — Ambulatory Visit: Payer: Self-pay | Admitting: Obstetrics and Gynecology

## 2023-12-24 LAB — CERVICOVAGINAL ANCILLARY ONLY
Bacterial Vaginitis (gardnerella): POSITIVE — AB
Candida Glabrata: NEGATIVE
Candida Vaginitis: NEGATIVE
Chlamydia: NEGATIVE
Comment: NEGATIVE
Comment: NEGATIVE
Comment: NEGATIVE
Comment: NEGATIVE
Comment: NEGATIVE
Comment: NORMAL
Neisseria Gonorrhea: NEGATIVE
Trichomonas: NEGATIVE

## 2023-12-24 MED ORDER — METRONIDAZOLE 0.75 % VA GEL: 1.0000 | Freq: Every day | VAGINAL | 0 refills | Status: AC
